# Patient Record
Sex: Female | Born: 1985 | Hispanic: Yes | Smoking: Never smoker
Health system: Southern US, Community
[De-identification: ages and names within clinical notes are randomized; demographics above are authoritative.]

## PROBLEM LIST (undated history)

## (undated) DIAGNOSIS — J45909 Unspecified asthma, uncomplicated: Secondary | ICD-10-CM

## (undated) DIAGNOSIS — F419 Anxiety disorder, unspecified: Secondary | ICD-10-CM

## (undated) DIAGNOSIS — IMO0002 Reserved for concepts with insufficient information to code with codable children: Secondary | ICD-10-CM

## (undated) DIAGNOSIS — F32A Depression, unspecified: Secondary | ICD-10-CM

## (undated) DIAGNOSIS — Z9109 Other allergy status, other than to drugs and biological substances: Secondary | ICD-10-CM

## (undated) DIAGNOSIS — D649 Anemia, unspecified: Secondary | ICD-10-CM

## (undated) DIAGNOSIS — R42 Dizziness and giddiness: Secondary | ICD-10-CM

## (undated) DIAGNOSIS — E559 Vitamin D deficiency, unspecified: Secondary | ICD-10-CM

## (undated) HISTORY — DX: Dizziness and giddiness: R42

## (undated) HISTORY — DX: Unspecified asthma, uncomplicated: J45.909

## (undated) HISTORY — DX: Reserved for concepts with insufficient information to code with codable children: IMO0002

## (undated) HISTORY — DX: Other allergy status, other than to drugs and biological substances: Z91.09

---

## 2006-08-09 ENCOUNTER — Emergency Department: Payer: Self-pay | Admitting: Internal Medicine

## 2008-07-10 ENCOUNTER — Emergency Department: Payer: Self-pay | Admitting: Emergency Medicine

## 2008-11-13 DIAGNOSIS — E0590 Thyrotoxicosis, unspecified without thyrotoxic crisis or storm: Secondary | ICD-10-CM

## 2008-11-13 HISTORY — DX: Thyrotoxicosis, unspecified without thyrotoxic crisis or storm: E05.90

## 2009-02-14 ENCOUNTER — Emergency Department: Payer: Self-pay | Admitting: Internal Medicine

## 2011-06-28 LAB — HM PAP SMEAR: HM PAP: NEGATIVE

## 2011-10-10 ENCOUNTER — Ambulatory Visit: Payer: Self-pay | Admitting: Internal Medicine

## 2012-07-10 ENCOUNTER — Ambulatory Visit: Payer: Self-pay

## 2013-07-07 ENCOUNTER — Emergency Department: Payer: Self-pay | Admitting: Internal Medicine

## 2013-07-07 LAB — CBC
HCT: 40.1 % (ref 35.0–47.0)
HGB: 13.7 g/dL (ref 12.0–16.0)
MCH: 29.7 pg (ref 26.0–34.0)
MCV: 87 fL (ref 80–100)
Platelet: 279 10*3/uL (ref 150–440)
RBC: 4.62 10*6/uL (ref 3.80–5.20)
RDW: 12.9 % (ref 11.5–14.5)
WBC: 5.7 10*3/uL (ref 3.6–11.0)

## 2013-07-07 LAB — COMPREHENSIVE METABOLIC PANEL
Alkaline Phosphatase: 55 U/L (ref 50–136)
Anion Gap: 5 — ABNORMAL LOW (ref 7–16)
Bilirubin,Total: 0.4 mg/dL (ref 0.2–1.0)
Calcium, Total: 9.2 mg/dL (ref 8.5–10.1)
Chloride: 106 mmol/L (ref 98–107)
Co2: 26 mmol/L (ref 21–32)
Osmolality: 271 (ref 275–301)
Potassium: 4.1 mmol/L (ref 3.5–5.1)
SGOT(AST): 19 U/L (ref 15–37)
SGPT (ALT): 17 U/L (ref 12–78)
Sodium: 137 mmol/L (ref 136–145)

## 2013-07-07 LAB — URINALYSIS, COMPLETE
Blood: NEGATIVE
Specific Gravity: 1.006 (ref 1.003–1.030)
WBC UR: 1 /HPF (ref 0–5)

## 2013-07-07 LAB — TSH: Thyroid Stimulating Horm: 3.47 u[IU]/mL

## 2013-08-03 ENCOUNTER — Ambulatory Visit: Payer: Self-pay | Admitting: Family Medicine

## 2013-08-03 LAB — CBC WITH DIFFERENTIAL/PLATELET
Basophil #: 0 10*3/uL (ref 0.0–0.1)
Basophil %: 0.6 %
HCT: 40.3 % (ref 35.0–47.0)
HGB: 13.1 g/dL (ref 12.0–16.0)
Lymphocyte %: 22.4 %
MCH: 28.4 pg (ref 26.0–34.0)
MCHC: 32.6 g/dL (ref 32.0–36.0)
Monocyte #: 0.5 x10 3/mm (ref 0.2–0.9)
Neutrophil #: 4.3 10*3/uL (ref 1.4–6.5)
Neutrophil %: 67 %
Platelet: 262 10*3/uL (ref 150–440)
RBC: 4.63 10*6/uL (ref 3.80–5.20)
WBC: 6.4 10*3/uL (ref 3.6–11.0)

## 2013-08-03 LAB — URINALYSIS, COMPLETE
Bilirubin,UR: NEGATIVE
Blood: NEGATIVE
Leukocyte Esterase: NEGATIVE
Nitrite: NEGATIVE
Protein: NEGATIVE
Specific Gravity: 1.025 (ref 1.003–1.030)

## 2013-08-03 LAB — LIPASE, BLOOD: Lipase: 397 U/L — ABNORMAL HIGH (ref 73–393)

## 2013-08-03 LAB — COMPREHENSIVE METABOLIC PANEL
Albumin: 3.9 g/dL (ref 3.4–5.0)
Alkaline Phosphatase: 61 U/L (ref 50–136)
Anion Gap: 10 (ref 7–16)
Bilirubin,Total: 0.3 mg/dL (ref 0.2–1.0)
Calcium, Total: 8.6 mg/dL (ref 8.5–10.1)
Chloride: 105 mmol/L (ref 98–107)
Co2: 24 mmol/L (ref 21–32)
Creatinine: 0.56 mg/dL — ABNORMAL LOW (ref 0.60–1.30)
EGFR (African American): >60
EGFR (Non-African Amer.): >60
SGPT (ALT): 18 U/L (ref 12–78)

## 2013-08-03 LAB — AMYLASE: Amylase: 104 U/L (ref 25–115)

## 2013-08-03 LAB — PREGNANCY, URINE: Pregnancy Test, Urine: NEGATIVE m[IU]/mL

## 2013-10-28 ENCOUNTER — Ambulatory Visit (INDEPENDENT_AMBULATORY_CARE_PROVIDER_SITE_OTHER): Payer: BC Managed Care – PPO | Admitting: Podiatry

## 2013-10-28 ENCOUNTER — Encounter: Payer: Self-pay | Admitting: Podiatry

## 2013-10-28 ENCOUNTER — Ambulatory Visit (INDEPENDENT_AMBULATORY_CARE_PROVIDER_SITE_OTHER): Payer: BC Managed Care – PPO

## 2013-10-28 VITALS — BP 129/86 | HR 80 | Resp 16 | Ht 62.0 in | Wt 120.0 lb

## 2013-10-28 DIAGNOSIS — M21612 Bunion of left foot: Secondary | ICD-10-CM

## 2013-10-28 DIAGNOSIS — M2010 Hallux valgus (acquired), unspecified foot: Secondary | ICD-10-CM

## 2013-10-28 DIAGNOSIS — M21619 Bunion of unspecified foot: Secondary | ICD-10-CM

## 2013-10-28 DIAGNOSIS — M779 Enthesopathy, unspecified: Secondary | ICD-10-CM

## 2013-10-28 MED ORDER — TRIAMCINOLONE ACETONIDE 10 MG/ML IJ SUSP
10.0000 mg | Freq: Once | INTRAMUSCULAR | Status: AC
  Administered 2013-10-28: 10 mg

## 2013-10-28 NOTE — Progress Notes (Signed)
Subjective:     Patient ID: Laura Morton, female   DOB: Oct 05, 1986, 27 y.o.   MRN: [id number]  Foot Pain   stating I'm having pain around my left big toe joint of several weeks duration. States she does not remember specific injury but does note that her sister has bunions   Review of Systems  All other systems reviewed and are negative.       Objective:   Physical Exam  Nursing note and vitals reviewed. Constitutional: She is oriented to person, place, and time. She appears well-nourished.  Cardiovascular: Intact distal pulses.   Musculoskeletal: Normal range of motion.  Neurological: She is oriented to person, place, and time.  Skin: Skin is warm.   neurovascular status intact with normal muscle strength noted. Edema noted around the first metatarsal head left with fluid buildup and no loss or range of motion or crepitus upon movement of the first MPJ    Assessment:     Probable capsulitis of the first MPJ with mild structural bunion deformity    Plan:     H&P and x-ray reviewed. Today I injected around the first MPJ 3 mg Kenalog 5 mg Xylocaine Marcaine mixture and instructed on wider shoes for the next few days and to reappoint if symptoms continue

## 2013-10-28 NOTE — Progress Notes (Signed)
N PAIN L LEFT FOOT AROUND GREAT TOE D 2 WEEKS O SLOWLY C WORSE A AM BAD, WALKING T CHANGED SHOES,

## 2014-02-07 ENCOUNTER — Ambulatory Visit: Payer: Self-pay | Admitting: Physician Assistant

## 2014-10-22 ENCOUNTER — Ambulatory Visit: Payer: Self-pay | Admitting: General Practice

## 2015-10-09 ENCOUNTER — Encounter: Payer: Self-pay | Admitting: Gynecology

## 2015-10-09 ENCOUNTER — Ambulatory Visit
Admission: EM | Admit: 2015-10-09 | Discharge: 2015-10-09 | Disposition: A | Discharge status: Home / Self Care | Payer: PRIVATE HEALTH INSURANCE | Attending: Family Medicine | Admitting: Family Medicine

## 2015-10-09 DIAGNOSIS — J029 Acute pharyngitis, unspecified: Secondary | ICD-10-CM

## 2015-10-09 LAB — RAPID STREP SCREEN (MED CTR MEBANE ONLY): Streptococcus, Group A Screen (Direct): NEGATIVE

## 2015-10-09 MED ORDER — AMOXICILLIN 875 MG PO TABS: 875.0000 mg | ORAL_TABLET | Freq: Two times a day (BID) | ORAL | Status: DC

## 2015-10-09 NOTE — ED Notes (Signed)
Patient c/o sore throat / chills  X 2 days.

## 2015-10-09 NOTE — Discharge Instructions (Signed)
Take medication as prescribed. Rest. Drink plenty of fluids.   Follow up with your primary care physician as needed. Return to Urgent care as needed for new or worsening concerns.   Pharyngitis Pharyngitis is a sore throat (pharynx). There is redness, pain, and swelling of your throat. HOME CARE   Drink enough fluids to keep your pee (urine) clear or pale yellow.  Only take medicine as told by your doctor.  You may get sick again if you do not take medicine as told. Finish your medicines, even if you start to feel better.  Do not take aspirin.  Rest.  Rinse your mouth (gargle) with salt water ( tsp of salt per 1 qt of water) every 1-2 hours. This will help the pain.  If you are not at risk for choking, you can suck on hard candy or sore throat lozenges. GET HELP IF:  You have large, tender lumps on your neck.  You have a rash.  You cough up green, yellow-brown, or bloody spit. GET HELP RIGHT AWAY IF:   You have a stiff neck.  You drool or cannot swallow liquids.  You throw up (vomit) or are not able to keep medicine or liquids down.  You have very bad pain that does not go away with medicine.  You have problems breathing (not from a stuffy nose). MAKE SURE YOU:   Understand these instructions.  Will watch your condition.  Will get help right away if you are not doing well or get worse.   This information is not intended to replace advice given to you by your health care provider. Make sure you discuss any questions you have with your health care provider.   Document Released: 04/17/2008 Document Revised: 08/20/2013 Document Reviewed: 07/07/2013 Elsevier Interactive Patient Education [employer].

## 2015-10-09 NOTE — ED Provider Notes (Signed)
[facility]  ____________________________________________  Time seen: Approximately 5:32 PM  I have reviewed the triage vital signs and the nursing notes.   HISTORY  Chief Complaint Sore Throat   HPI Laura Morton is a 29 y.o. female presents for the complaints of 2 days of sore throat and fever. Reports did not check temperature but has recently felt warm with cold chills. States sore throat has persisted. Denies cough, congestion, runny nose, abdominal pain, nausea or other complaints. Denies known sick contacts. Reports significant other without any sickness.  States current sore throat pain is 5 out of 10 and aching and scratchy. States that it does hurt to swallow foods. States that cold fluids helps sore throat. Denies difficulty swallowing. Reports continues to eat and drink well.    Past Medical History  Diagnosis Date  . Asthma   . Environmental allergies   . Vertigo     There are no active problems to display for this patient.   History reviewed. No pertinent past surgical history.  Current Outpatient Rx  Name  Route  Sig  Dispense  Refill  . Albuterol Sulfate (PROAIR HFA IN)   Inhalation   Inhale into the lungs as needed.         . Beclomethasone Dipropionate (QVAR IN)   Inhalation   Inhale into the lungs as needed.         . Loratadine (CLARITIN PO)   Oral   Take by mouth daily.         . Montelukast Sodium (SINGULAIR PO)   Oral   Take by mouth daily.         . Multiple Vitamins-Minerals (MULTIVITAMIN PO)   Oral   Take by mouth daily.         Marland Kitchen PARAGARD INTRAUTERINE COPPER IUD IUD   Intrauterine   1 each by Intrauterine route once.         . Levonorgestrel (MIRENA IU)   Intrauterine   by Intrauterine route daily.          Last menstrual.: Current. Denies chance of pregnancy.  Allergies Review of patient's allergies indicates no known allergies.  No family history on file.  Social History Social History   Substance Use Topics  . Smoking status: Never Smoker   . Smokeless tobacco: Never Used  . Alcohol Use: No    Review of Systems Constitutional: Positive subjective fevers. States did not measure with thermometer.  Eyes: No visual changes. [id number] sore throat. Denies cough, congestion, runny nose.  Cardiovascular: Denies chest pain. Respiratory: Denies shortness of breath. Gastrointestinal: No abdominal pain.  No nausea, no vomiting.  No diarrhea.  No constipation. Genitourinary: Negative for dysuria. Musculoskeletal: Negative for back pain. Skin: Negative for rash. Neurological: Negative for headaches, focal weakness or numbness.  10-point ROS otherwise negative.  ____________________________________________   PHYSICAL EXAM:  VITAL SIGNS: ED Triage Vitals  Enc Vitals Group     BP 10/09/15 1709 120/78 mmHg     Pulse Rate 10/09/15 1709 98     Resp -- 18     Temp 10/09/15 1709 99.4 F (37.4 C)     Temp Source 10/09/15 1709 Oral     SpO2 10/09/15 1709 100 %     Weight 10/09/15 1709 105 lb (47.628 kg)     Height 10/09/15 1709 5\' 2"  (1.575 m)     Head Cir --      Peak Flow --      Pain Score 10/09/15 1709 8  Pain Loc --      Pain Edu? --      Excl. in [place]? --     Constitutional: Alert and oriented. Well appearing and in no acute distress. Eyes: Conjunctivae are normal. PERRL. EOMI. Head: Atraumatic.Nontender sinuses. No swelling. No erythema.  Ears: no erythema, normal TMs bilaterally.   Nose: No congestion/rhinnorhea.  Mouth/Throat: Mucous membranes are moist. Moderate pharyngeal erythema with 1-2+ bilateral tonsillar swelling, with bilateral mild amount of exudate . No uvular shift or deviation.  Neck: No stridor.  No cervical spine tenderness to palpation. Hematological/Lymphatic/Immunilogical:Mild anterior  cervical lymphadenopathy. Cardiovascular: Normal rate, regular rhythm. Grossly normal heart sounds.  Good peripheral circulation. Respiratory: Normal  respiratory effort.  No retractions. Lungs CTAB.No wheezes, rales or rhonchi.  Gastrointestinal: Soft and nontender. No distention. Normal Bowel sounds. No hepatomegaly, no splenomegaly palpated.  Musculoskeletal: No lower or upper extremity tenderness nor edema.   Neurologic:  Normal speech and language. No gross focal neurologic deficits are appreciated. No gait instability. Skin:  Skin is warm, dry and intact. No rash noted. Psychiatric: Mood and affect are normal. Speech and behavior are normal.  ____________________________________________   LABS (all labs ordered are listed, but only abnormal results are displayed)  Labs Reviewed  RAPID STREP SCREEN (NOT AT [facility])  CULTURE, GROUP A STREP ([place])    INITIAL IMPRESSION / ASSESSMENT AND PLAN / ED COURSE  Pertinent labs & imaging results that were available during my care of the patient were reviewed by me and considered in my medical decision making (see chart for details).  Very well-appearing patient. No acute distress. Presents for the complaints of 2 days of sore throat and fever. Reports continues to drink fluids well. Denies known sick contacts. Patient with moderate pharyngeal erythema and bilateral tonsillar swelling and mild exudate. Suspect streptococcal pharyngitis. Quick strep negative, will culture. Discussed patient evaluation and testing for mono. States that she does not want to be tested for mono at this time and reports that if sore throat continues she will follow-up with her primary care physician or urgent care. Again suspect streptococcal pharyngitis. Will treat with oral amoxicillin and supportive treatments including rest, fluids, when necessary over-the-counter Tylenol or ibuprofen.  Discussed follow up with Primary care physician this week. Discussed follow up and return parameters including no resolution or any worsening concerns. Patient verbalized understanding and agreed to plan.    ____________________________________________   FINAL CLINICAL IMPRESSION(S) / ED DIAGNOSES  Final diagnoses:  Pharyngitis       Marylene Land, NP 10/09/15 1810

## 2015-10-13 LAB — CULTURE, GROUP A STREP (THRC)

## 2015-10-14 ENCOUNTER — Telehealth: Payer: Self-pay | Admitting: Emergency Medicine

## 2015-10-14 NOTE — ED Notes (Signed)
Patient notified that her throat culture came back positive for Strep.  Patient was instructed to continue with her Amoxicillin and to follow-up here or with her PCP if her symptoms do not improve or worsen.  Patient verbalized understanding.

## 2015-12-13 DIAGNOSIS — J309 Allergic rhinitis, unspecified: Secondary | ICD-10-CM | POA: Insufficient documentation

## 2015-12-13 DIAGNOSIS — J45909 Unspecified asthma, uncomplicated: Secondary | ICD-10-CM

## 2015-12-15 ENCOUNTER — Ambulatory Visit (INDEPENDENT_AMBULATORY_CARE_PROVIDER_SITE_OTHER): Payer: 59 | Admitting: Unknown Physician Specialty

## 2015-12-15 ENCOUNTER — Encounter: Payer: Self-pay | Admitting: Unknown Physician Specialty

## 2015-12-15 VITALS — BP 126/83 | HR 81 | Temp 98.4°F | Ht 62.6 in | Wt 110.8 lb

## 2015-12-15 DIAGNOSIS — Z7184 Encounter for health counseling related to travel: Secondary | ICD-10-CM

## 2015-12-15 DIAGNOSIS — Z7189 Other specified counseling: Secondary | ICD-10-CM

## 2015-12-15 DIAGNOSIS — F40243 Fear of flying: Secondary | ICD-10-CM | POA: Diagnosis not present

## 2015-12-15 MED ORDER — LORAZEPAM 0.5 MG PO TABS: 0.5000 mg | ORAL_TABLET | Freq: Two times a day (BID) | ORAL | Status: DC | PRN

## 2015-12-15 NOTE — Progress Notes (Signed)
   BP 126/83 mmHg  Pulse 81  Temp(Src) 98.4 F (36.9 C)  Ht 5' 2.6" (1.59 m)  Wt 110 lb 12.8 oz (50.259 kg)  BMI 19.88 kg/m2  SpO2 95%  LMP 12/03/2015 (Exact Date)   Subjective:    Patient ID: Laura Morton, female    DOB: 05-28-1986, 30 y.o.   MRN: [id number]  HPI: Laura Morton is a 30 y.o. female  Chief Complaint  Patient presents with  . Establish Care    pt states she wants to talk about vaccines and her flight to [place] coming up in April   Travel vaccines Pt states she has never flown before and would like something for anxiety for her flight.  She also wonders what her vaccines are.   Health Maintenance Pt goes to [facility] for her health maintenance items.    Old chart reviewed.   Family and PMH reviewed.    Relevant past medical, surgical, family and social history reviewed and updated as indicated. Interim medical history since our last visit reviewed. Allergies and medications reviewed and updated.  Review of Systems  Constitutional: Negative.   HENT: Negative.   Eyes: Negative.   Respiratory: Negative.   Cardiovascular: Negative.   Gastrointestinal: Negative.   Endocrine: Negative.   Genitourinary: Negative.   Musculoskeletal: Negative.   Skin: Negative.   Allergic/Immunologic: Negative.   Neurological: Negative.   Hematological: Negative.   Psychiatric/Behavioral: Negative.     Per HPI unless specifically indicated above     Objective:    BP 126/83 mmHg  Pulse 81  Temp(Src) 98.4 F (36.9 C)  Ht 5' 2.6" (1.59 m)  Wt 110 lb 12.8 oz (50.259 kg)  BMI 19.88 kg/m2  SpO2 95%  LMP 12/03/2015 (Exact Date)  Wt Readings from Last 3 Encounters:  12/15/15 110 lb 12.8 oz (50.259 kg)  10/09/15 105 lb (47.628 kg)  10/28/13 120 lb (54.432 kg)    Physical Exam  Constitutional: She is oriented to person, place, and time. She appears well-developed and well-nourished. No distress.  HENT:  Head: Normocephalic and atraumatic.  Eyes: Conjunctivae and  lids are normal. Right eye exhibits no discharge. Left eye exhibits no discharge. No scleral icterus.  Cardiovascular: Normal rate.   Pulmonary/Chest: Effort normal.  Abdominal: Normal appearance. There is no splenomegaly or hepatomegaly.  Musculoskeletal: Normal range of motion.  Neurological: She is alert and oriented to person, place, and time.  Skin: Skin is intact. No rash noted. No pallor.  Psychiatric: She has a normal mood and affect. Her behavior is normal. Judgment and thought content normal.    Results for orders placed or performed in visit on 12/13/15  HM PAP SMEAR  Result Value Ref Range   HM Pap smear negative-from PP       Assessment & Plan:   Problem List Items Addressed This Visit      Unprioritized   Fear of flying - Primary   Relevant Medications   LORazepam (ATIVAN) 0.5 MG tablet    Other Visit Diagnoses    Travel advice encounter        Prescription written for Hepatitis A and Typhoid        Follow up plan: Return if symptoms worsen or fail to improve.

## 2016-01-07 ENCOUNTER — Emergency Department: Payer: PRIVATE HEALTH INSURANCE

## 2016-01-07 ENCOUNTER — Encounter: Payer: Self-pay | Admitting: Emergency Medicine

## 2016-01-07 ENCOUNTER — Emergency Department
Admission: EM | Admit: 2016-01-07 | Discharge: 2016-01-07 | Disposition: A | Discharge status: Home / Self Care | Payer: PRIVATE HEALTH INSURANCE | Attending: Emergency Medicine | Admitting: Emergency Medicine

## 2016-01-07 DIAGNOSIS — Y998 Other external cause status: Secondary | ICD-10-CM | POA: Insufficient documentation

## 2016-01-07 DIAGNOSIS — Z7951 Long term (current) use of inhaled steroids: Secondary | ICD-10-CM | POA: Diagnosis not present

## 2016-01-07 DIAGNOSIS — M779 Enthesopathy, unspecified: Secondary | ICD-10-CM | POA: Insufficient documentation

## 2016-01-07 DIAGNOSIS — Y9389 Activity, other specified: Secondary | ICD-10-CM | POA: Insufficient documentation

## 2016-01-07 DIAGNOSIS — Y92410 Unspecified street and highway as the place of occurrence of the external cause: Secondary | ICD-10-CM | POA: Diagnosis not present

## 2016-01-07 DIAGNOSIS — Z793 Long term (current) use of hormonal contraceptives: Secondary | ICD-10-CM | POA: Insufficient documentation

## 2016-01-07 DIAGNOSIS — S4991XA Unspecified injury of right shoulder and upper arm, initial encounter: Secondary | ICD-10-CM | POA: Diagnosis present

## 2016-01-07 DIAGNOSIS — M7581 Other shoulder lesions, right shoulder: Secondary | ICD-10-CM

## 2016-01-07 DIAGNOSIS — Z79899 Other long term (current) drug therapy: Secondary | ICD-10-CM | POA: Insufficient documentation

## 2016-01-07 MED ORDER — IBUPROFEN 800 MG PO TABS
800.0000 mg | ORAL_TABLET | Freq: Once | ORAL | Status: AC
  Administered 2016-01-07: 800 mg via ORAL
  Filled 2016-01-07: qty 1
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
## 2016-01-07 NOTE — Discharge Instructions (Signed)
Rotator Cuff Tendinitis  Rotator cuff tendinitis is inflammation of the tough, cord-like bands that connect muscle to bone (tendons) in your rotator cuff. Your rotator cuff is the collection of all the muscles and tendons that connect your arm to your shoulder. Your rotator cuff holds the head of your upper arm bone (humerus) in the cup (fossa) of your shoulder blade (scapula).  CAUSES  Rotator cuff tendinitis is usually caused by overusing the joint involved.   SIGNS AND SYMPTOMS  · Deep ache in the shoulder also felt on the outside upper arm over the shoulder muscle.  · Point tenderness over the area that is injured.  · Pain comes on gradually and becomes worse with lifting the arm to the side (abduction) or turning it inward (internal rotation).  · May lead to a chronic tear: When a rotator cuff tendon becomes inflamed, it runs the risk of losing its blood supply, causing some tendon fibers to die. This increases the risk that the tendon can fray and partially or completely tear.  DIAGNOSIS  Rotator cuff tendinitis is diagnosed by taking a medical history, performing a physical exam, and reviewing results of imaging exams. The medical history is useful to help determine the type of rotator cuff injury. The physical exam will include looking at the injured shoulder, feeling the injured area, and watching you do range-of-motion exercises. X-ray exams are typically done to rule out other causes of shoulder pain, such as fractures. MRI is the imaging exam usually used for significant shoulder injuries. Sometimes a dye study called CT arthrogram is done, but it is not as widely used as MRI. In some institutions, special ultrasound tests may also be used to aid in the diagnosis.  TREATMENT   Less Severe Cases  · Use of a sling to rest the shoulder for a short period of time. Prolonged use of the sling can cause stiffness, weakness, and loss of motion of the shoulder joint.  · Anti-inflammatory medicines, such as  ibuprofen or naproxen sodium, may be prescribed.  More Severe Cases  · Physical therapy.  · Use of steroid injections into the shoulder joint.  · Surgery.  HOME CARE INSTRUCTIONS   · Use a sling or splint until the pain decreases. Prolonged use of the sling can cause stiffness, weakness, and loss of motion of the shoulder joint.  · Apply ice to the injured area:    Put ice in a plastic bag.    Place a towel between your skin and the bag.    Leave the ice on for 20 minutes, 2-3 times a day.  · Try to avoid use other than gentle range of motion while your shoulder is painful. Use the shoulder and exercise only as directed by your health care provider. Stop exercises or range of motion if pain or discomfort increases, unless directed otherwise by your health care provider.  · Only take over-the-counter or prescription medicines for pain, discomfort, or fever as directed by your health care provider.  · If you were given a shoulder sling and straps (immobilizer), do not remove it except as directed, or until you see a health care provider for a follow-up exam. If you need to remove it, move your arm as little as possible or as directed.  · You may want to sleep on several pillows at night to lessen swelling and pain.  SEEK IMMEDIATE MEDICAL CARE IF:   · Your shoulder pain increases or new pain develops in your arm, hand,   or fingers and is not relieved with medicines.  · You have new, unexplained symptoms, especially increased numbness in the hands or loss of strength.  · You develop any worsening of the problems that brought you in for care.  · Your arm, hand, or fingers are numb or tingling.  · Your arm, hand, or fingers are swollen, painful, or turn white or blue.  MAKE SURE YOU:  · Understand these instructions.  · Will watch your condition.  · Will get help right away if you are not doing well or get worse.     This information is not intended to replace advice given to you by your health care provider. Make sure  you discuss any questions you have with your health care provider.     Document Released: 01/20/2004 Document Revised: 11/20/2014 Document Reviewed: 06/11/2013  Elsevier Interactive Patient Education ©2016 [employer]
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
## 2017-07-08 ENCOUNTER — Emergency Department: Payer: Self-pay

## 2017-07-08 ENCOUNTER — Encounter: Payer: Self-pay | Admitting: Emergency Medicine

## 2017-07-08 ENCOUNTER — Emergency Department
Admission: EM | Admit: 2017-07-08 | Discharge: 2017-07-08 | Disposition: A | Discharge status: Home / Self Care | Payer: Self-pay | Attending: Emergency Medicine | Admitting: Emergency Medicine

## 2017-07-08 DIAGNOSIS — D500 Iron deficiency anemia secondary to blood loss (chronic): Secondary | ICD-10-CM

## 2017-07-08 DIAGNOSIS — J45909 Unspecified asthma, uncomplicated: Secondary | ICD-10-CM | POA: Insufficient documentation

## 2017-07-08 DIAGNOSIS — N3001 Acute cystitis with hematuria: Secondary | ICD-10-CM

## 2017-07-08 DIAGNOSIS — R0602 Shortness of breath: Secondary | ICD-10-CM | POA: Insufficient documentation

## 2017-07-08 DIAGNOSIS — I878 Other specified disorders of veins: Secondary | ICD-10-CM

## 2017-07-08 DIAGNOSIS — Z79899 Other long term (current) drug therapy: Secondary | ICD-10-CM | POA: Insufficient documentation

## 2017-07-08 LAB — URINALYSIS, COMPLETE (UACMP) WITH MICROSCOPIC
Bilirubin Urine: NEGATIVE
GLUCOSE, UA: NEGATIVE mg/dL
KETONES UR: 5 mg/dL — AB
Nitrite: NEGATIVE
PH: 5 (ref 5.0–8.0)
Protein, ur: 30 mg/dL — AB
Specific Gravity, Urine: 1.027 (ref 1.005–1.030)

## 2017-07-08 LAB — COMPREHENSIVE METABOLIC PANEL
ALBUMIN: 4.2 g/dL (ref 3.5–5.0)
ALT: 12 U/L — ABNORMAL LOW (ref 14–54)
ANION GAP: 5 (ref 5–15)
AST: 20 U/L (ref 15–41)
Alkaline Phosphatase: 51 U/L (ref 38–126)
BILIRUBIN TOTAL: 0.2 mg/dL — AB (ref 0.3–1.2)
BUN: 9 mg/dL (ref 6–20)
CHLORIDE: 110 mmol/L (ref 101–111)
CO2: 24 mmol/L (ref 22–32)
Calcium: 9.2 mg/dL (ref 8.9–10.3)
Creatinine, Ser: 0.54 mg/dL (ref 0.44–1.00)
GFR calc Af Amer: >60 mL/min (ref >60)
Glucose, Bld: 117 mg/dL — ABNORMAL HIGH (ref 65–99)
POTASSIUM: 3.9 mmol/L (ref 3.5–5.1)
Sodium: 139 mmol/L (ref 135–145)
TOTAL PROTEIN: 7.4 g/dL (ref 6.5–8.1)

## 2017-07-08 LAB — CBC WITH DIFFERENTIAL/PLATELET
BASOS PCT: 1 %
Basophils Absolute: 0 10*3/uL (ref 0–0.1)
EOS PCT: 3 %
Eosinophils Absolute: 0.1 10*3/uL (ref 0–0.7)
HEMATOCRIT: 30.4 % — AB (ref 35.0–47.0)
Hemoglobin: 9.9 g/dL — ABNORMAL LOW (ref 12.0–16.0)
Lymphocytes Relative: 29 %
Lymphs Abs: 1.4 10*3/uL (ref 1.0–3.6)
MCH: 23.5 pg — ABNORMAL LOW (ref 26.0–34.0)
MCHC: 32.4 g/dL (ref 32.0–36.0)
MCV: 72.4 fL — AB (ref 80.0–100.0)
MONOS PCT: 10 %
Monocytes Absolute: 0.5 10*3/uL (ref 0.2–0.9)
NEUTROS ABS: 2.8 10*3/uL (ref 1.4–6.5)
Neutrophils Relative %: 57 %
PLATELETS: 367 10*3/uL (ref 150–440)
RBC: 4.19 MIL/uL (ref 3.80–5.20)
RDW: 15.7 % — ABNORMAL HIGH (ref 11.5–14.5)
WBC: 5 10*3/uL (ref 3.6–11.0)

## 2017-07-08 LAB — FIBRIN DERIVATIVES D-DIMER (ARMC ONLY): FIBRIN DERIVATIVES D-DIMER (ARMC): 157.89 (ref 0.00–499.00)

## 2017-07-08 LAB — TSH: TSH: 3.721 u[IU]/mL (ref 0.350–4.500)

## 2017-07-08 LAB — BRAIN NATRIURETIC PEPTIDE: B NATRIURETIC PEPTIDE 5: 50 pg/mL (ref 0.0–100.0)

## 2017-07-08 LAB — POCT PREGNANCY, URINE: Preg Test, Ur: NEGATIVE

## 2017-07-08 MED ORDER — NITROFURANTOIN MONOHYD MACRO 100 MG PO CAPS
100.0000 mg | ORAL_CAPSULE | Freq: Once | ORAL | Status: AC
  Administered 2017-07-08: 100 mg via ORAL
  Filled 2017-07-08: qty 1

## 2017-07-08 MED ORDER — IRON 325 (65 FE) MG PO TABS
1.0000 | ORAL_TABLET | Freq: Every day | ORAL | 1 refills | Status: DC
Start: 2017-07-08 — End: 2020-06-09

## 2017-07-08 MED ORDER — SODIUM CHLORIDE 0.9 % IV BOLUS (SEPSIS)
1000.0000 mL | Freq: Once | INTRAVENOUS | Status: AC
  Administered 2017-07-08: 1000 mL via INTRAVENOUS

## 2017-07-08 MED ORDER — NITROFURANTOIN MONOHYD MACRO 100 MG PO CAPS: 100.0000 mg | ORAL_CAPSULE | Freq: Two times a day (BID) | ORAL | 0 refills | Status: AC

## 2017-07-08 NOTE — ED Notes (Signed)
Dr Alfred Levins back to bedside for update and re-eval

## 2017-07-08 NOTE — ED Notes (Signed)
Spoke with Dr. Archie Balboa about pt. Per Dr. Archie Balboa pt needs CBC, CMP, Ua, and Urine preg.

## 2017-07-08 NOTE — ED Provider Notes (Signed)
[facility] Emergency Department Provider Note  ____________________________________________  Time seen: Approximately 7:25 PM  I have reviewed the triage vital signs and the nursing notes.   HISTORY  Chief Complaint Leg Swelling; Shortness of Breath; and Vaginal Discharge   HPI Laura Morton is a 31 y.o. female the history of asthma who presents for evaluation of bilateral leg swelling and shortness of breath. Patient reports over the course of the last 4-5 months she has noticed progressively worsening swelling of both her legs with left worse than right. The swelling usually resolved by the morning but by the end of the day gets worse. 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 Over the course of the last 2 months she has noticed progressively worsening shortness of breath and tells me now she is unable to go up a flight of stairs without feeling very winded. She has had asthma as a kid but denies any wheezing, cough, fever, or chest pain. Patient denies personal or family history blood clots, her last longer trips was 2 months ago when she came back from [place] where she was leaving for a year. She has an IUD but the [place] one and no hormones. No pain in her legs. No hemoptysis. NO Sob at rest.   Past Medical History:  Diagnosis Date  . Asthma   . Environmental allergies   . Knee fracture, left   . Vertigo     Patient Active Problem List   Diagnosis Date Noted  . 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 Allergic rhinitis 12/13/2015  . Asthma 12/13/2015    History reviewed. No pertinent surgical history.  Prior to Admission medications   Medication Sig Start Date End Date Taking? Authorizing Provider  Albuterol Sulfate (PROAIR HFA IN) Inhale into the lungs as needed.    [provider]  beclomethasone (QVAR) 40 MCG/ACT inhaler Inhale into the lungs 2 (two) times daily.    [provider]  Ferrous Sulfate (IRON) 325 (65 Fe) MG  TABS Take 1 tablet (325 mg total) by mouth daily. 07/08/17   Rudene Re, MD  loratadine (CLARITIN) 10 MG tablet Take 10 mg by mouth daily.    [provider]  LORazepam (ATIVAN) 0.5 MG tablet Take 1 tablet (0.5 mg total) by mouth 2 (two) times daily as needed for anxiety. 12/15/15   Kathrine Haddock, NP  montelukast (SINGULAIR) 10 MG tablet Take 10 mg by mouth daily.    [provider]  Multiple Vitamins-Minerals (MULTIVITAMIN PO) Take by mouth daily.    [provider]  nitrofurantoin, macrocrystal-monohydrate, (MACROBID) 100 MG capsule Take 1 capsule (100 mg total) by mouth 2 (two) times daily. 07/08/17 07/13/17  Rudene Re, MD  [place] IUD IUD 1 each by Intrauterine route once.    [provider]    Allergies Patient has no known allergies.  Family History  Problem Relation Age of Onset  . Diabetes Mother   . Lupus Mother   . Hypertension Mother   . Hypertension Father   . Heart disease Sister   . Spina bifida Sister     Social History Social History  Substance Use Topics  . Smoking status: Never Smoker  . Smokeless tobacco: Never Used  . Alcohol use No    Review of Systems  Constitutional: Negative for fever. Eyes: Negative for visual changes. ENT: Negative for sore throat. Neck: No neck pain  Cardiovascular: Negative for chest pain. Respiratory: + shortness of  breath. Gastrointestinal: Negative for abdominal pain, vomiting or diarrhea. Genitourinary: Negative for dysuria. Musculoskeletal: Negative for back pain. + b/l leg swelling Skin: Negative for rash. Neurological: Negative for headaches, weakness or numbness. Psych: No SI or HI  ____________________________________________   PHYSICAL EXAM:  VITAL SIGNS: ED Triage Vitals [07/08/17 1602]  Enc Vitals Group     BP 124/88     Pulse Rate (!) 103     Resp (!) 6     Temp 98.7 F (37.1 C)     Temp Source Oral     SpO2 97 %     Weight       Height      Head Circumference      Peak Flow      Pain Score      Pain Loc      Pain Edu?      Excl. in [place]?     Constitutional: Alert and oriented. Well appearing and in no apparent distress. HEENT:      Head: Normocephalic and atraumatic.         Eyes: Conjunctivae are normal. Sclera is non-icteric.       Mouth/Throat: Mucous membranes are moist.       Neck: Supple with no signs of meningismus. Cardiovascular: Tachycardic with regular rhythm. No murmurs, gallops, or rubs. 2+ symmetrical distal pulses are present in all extremities. No JVD. Respiratory: Normal respiratory effort. Lungs are clear to auscultation bilaterally. No wheezes, crackles, or rhonchi.  Gastrointestinal: Soft, non tender, and non distended with positive bowel sounds. No rebound or guarding. Genitourinary: No CVA tenderness. Musculoskeletal: Nontender with normal range of motion in all extremities. No edema, cyanosis, or erythema of extremities. Neurologic: Normal speech and language. Face is symmetric. Moving all extremities. No gross focal neurologic deficits are appreciated. Skin: Skin is warm, dry and intact. No rash noted. Psychiatric: Mood and affect are normal. Speech and behavior are normal.  ____________________________________________   LABS (all labs ordered are listed, but only abnormal results are displayed)  Labs Reviewed  CBC WITH DIFFERENTIAL/PLATELET - Abnormal; Notable for the following:       Result Value   Hemoglobin 9.9 (*)    HCT 30.4 (*)    MCV 72.4 (*)    MCH 23.5 (*)    RDW 15.7 (*)    All other components within normal limits  COMPREHENSIVE METABOLIC PANEL - Abnormal; Notable for the following:    Glucose, Bld 117 (*)    ALT 12 (*)    Total Bilirubin 0.2 (*)    All other components within normal limits  URINALYSIS, COMPLETE (UACMP) WITH MICROSCOPIC - Abnormal; Notable for the following:    Color, Urine AMBER (*)    APPearance HAZY (*)    Hgb urine dipstick MODERATE (*)     Ketones, ur 5 (*)    Protein, ur 30 (*)    Leukocytes, UA MODERATE (*)    Bacteria, UA FEW (*)    Squamous Epithelial / LPF 6-30 (*)    All other components within normal limits  URINE CULTURE  FIBRIN DERIVATIVES D-DIMER ([facility] ONLY)  BRAIN NATRIURETIC PEPTIDE  TSH  POC URINE PREG, ED  POCT PREGNANCY, URINE   ____________________________________________  EKG  ED ECG REPORT I, Rudene Re, the attending physician, personally viewed and interpreted this ECG.  Sinus tachycardia, rate of 100, normal intervals, normal axis, no ST elevations or depressions. ____________________________________________  RADIOLOGY  CXR: negative  Doppler US; negative  ____________________________________________   PROCEDURES  Procedure(s) performed: None Procedures Critical Care performed:  None ____________________________________________   INITIAL IMPRESSION / ASSESSMENT AND PLAN / ED COURSE   31 y.o. female the history of asthma who presents for evaluation of bilateral leg swelling and shortness of breath x several months. Patient is PERC positive due to tachycardia therefore will get Doppler studies of b/l LE to rule out DVT as etiology of her leg swelling and d-dimer to rule out PE. Lungs are clear, vitals are normal. No asymmetric edema or pitting edema of her lower extremities. Blood work consistent with microcytic anemia. Patient tells me that she has very heavy menstrual periods. She says she started spotting today and she is due for her menstrual period at this time. Her hemoglobin is 9.9 (13.1 in 2014). No active bleeding. I do believe her SOB is mostly due to her anemia. No evidence of asthma exacerbation. BNP negative and CXR with no evidence of PNA or edema. Leg swelling is most likely due to long periods of standing.     _________________________ 8:06 PM on 07/08/2017 -----------------------------------------  D-dimer negative, Doppler studies were no DVT, BNP within normal  limits, thyroid studies negative. I do believe at this time the patient's leg swelling is due to venous stasis from standing for several hours and I recommended that she uses compression stockings. Also believe that her shortness of breath is due to her chronic iron deficiency anemia. Recommended she start on iron supplementation. Patient will follow-up with her primary care doctor in 2-3 days. She was started on Macrobid for UTI. Discussed return precautions for any signs of worsening anemia.  Pertinent labs & imaging results that were available during my care of the patient were reviewed by me and considered in my medical decision making (see chart for details).    ____________________________________________   FINAL CLINICAL IMPRESSION(S) / ED DIAGNOSES  Final diagnoses:  Venous stasis  Iron deficiency anemia due to chronic blood loss  Acute cystitis with hematuria      NEW MEDICATIONS STARTED DURING THIS VISIT:  New Prescriptions   FERROUS SULFATE (IRON) 325 (65 FE) MG TABS    Take 1 tablet (325 mg total) by mouth daily.   NITROFURANTOIN, MACROCRYSTAL-MONOHYDRATE, (MACROBID) 100 MG CAPSULE    Take 1 capsule (100 mg total) by mouth 2 (two) times daily.     Note:  This document was prepared using Dragon voice recognition software and may include unintentional dictation errors. Sinus tachycardia, rate of 100, normal intervals, normal axis, no ST elevations or depressions.   Alfred Levins, [place], MD 07/08/17 2008

## 2017-07-08 NOTE — ED Triage Notes (Signed)
Pt states that she has been having swelling in her legs for several months. Pt states that she lived in [place] x 1 year and noticed that her legs were swelling more when she was there. Pt noticed that recently that the left leg is swollen worse than the right. Pt denies pain or redness in the leg. Pt states that she is also having vaginal discharge and shortness of breath as well.   Pt is able to talk in complete sentences in triage and does not appear to be in any distress at this time.

## 2017-07-08 NOTE — Discharge Instructions (Signed)
Use compression stockings. Take iron every day. Take antibiotics as prescribed. Follow up with your primary care doctor in 2 days. Return to the emergency room for worsening shortness of breath, chest pain, abdominal pain, feeling faint, flank pain, fever, nausea or vomiting.

## 2017-07-10 LAB — URINE CULTURE

## 2018-12-10 ENCOUNTER — Encounter: Payer: Self-pay | Admitting: Family Medicine

## 2018-12-10 ENCOUNTER — Ambulatory Visit (INDEPENDENT_AMBULATORY_CARE_PROVIDER_SITE_OTHER): Payer: 59 | Admitting: Family Medicine

## 2018-12-10 DIAGNOSIS — F419 Anxiety disorder, unspecified: Secondary | ICD-10-CM | POA: Diagnosis not present

## 2018-12-10 MED ORDER — BUSPIRONE HCL 7.5 MG PO TABS: 7.5000 mg | ORAL_TABLET | Freq: Two times a day (BID) | ORAL | 0 refills | Status: DC

## 2018-12-10 MED ORDER — HYDROXYZINE HCL 25 MG PO TABS: 25.0000 mg | ORAL_TABLET | Freq: Three times a day (TID) | ORAL | 0 refills | Status: DC | PRN

## 2018-12-10 NOTE — Patient Instructions (Signed)
Buspirone tablets What is this medicine? BUSPIRONE (byoo SPYE rone) is used to treat anxiety disorders. This medicine may be used for other purposes; ask your health care provider or pharmacist if you have questions. COMMON BRAND NAME(S): BuSpar What should I tell my health care provider before I take this medicine? They need to know if you have any of these conditions: -kidney or liver disease -an unusual or allergic reaction to buspirone, other medicines, foods, dyes, or preservatives -pregnant or trying to get pregnant -breast-feeding How should I use this medicine? Take this medicine by mouth with a glass of water. Follow the directions on the prescription label. You may take this medicine with or without food. To ensure that this medicine always works the same way for you, you should take it either always with or always without food. Take your doses at regular intervals. Do not take your medicine more often than directed. Do not stop taking except on the advice of your doctor or health care professional. Talk to your pediatrician regarding the use of this medicine in children. Special care may be needed. Overdosage: If you think you have taken too much of this medicine contact a poison control center or emergency room at once. NOTE: This medicine is only for you. Do not share this medicine with others. What if I miss a dose? If you miss a dose, take it as soon as you can. If it is almost time for your next dose, take only that dose. Do not take double or extra doses. What may interact with this medicine? Do not take this medicine with any of the following medications: -linezolid -MAOIs like Carbex, Eldepryl, Marplan, Nardil, and Parnate -methylene blue -procarbazine This medicine may also interact with the following medications: -diazepam -digoxin -diltiazem -erythromycin -grapefruit juice -haloperidol -medicines for mental depression or mood problems -medicines for seizures like  carbamazepine, phenobarbital and phenytoin -nefazodone -other medications for anxiety -rifampin -ritonavir -some antifungal medicines like itraconazole, ketoconazole, and voriconazole -verapamil -warfarin This list may not describe all possible interactions. Give your health care provider a list of all the medicines, herbs, non-prescription drugs, or dietary supplements you use. Also tell them if you smoke, drink alcohol, or use illegal drugs. Some items may interact with your medicine. What should I watch for while using this medicine? Visit your doctor or health care professional for regular checks on your progress. It may take 1 to 2 weeks before your anxiety gets better. You may get drowsy or dizzy. Do not drive, use machinery, or do anything that needs mental alertness until you know how this drug affects you. Do not stand or sit up quickly, especially if you are an older patient. This reduces the risk of dizzy or fainting spells. Alcohol can make you more drowsy and dizzy. Avoid alcoholic drinks. What side effects may I notice from receiving this medicine? Side effects that you should report to your doctor or health care professional as soon as possible: -blurred vision or other vision changes -chest pain -confusion -difficulty breathing -feelings of hostility or anger -muscle aches and pains -numbness or tingling in hands or feet -ringing in the ears -skin rash and itching -vomiting -weakness Side effects that usually do not require medical attention (report to your doctor or health care professional if they continue or are bothersome): -disturbed dreams, nightmares -headache -nausea -restlessness or nervousness -sore throat and nasal congestion -stomach upset This list may not describe all possible side effects. Call your doctor for medical advice about side   effects. You may report side effects to FDA at 1-800-FDA-1088. Where should I keep my medicine? Keep out of the reach  of children. Store at room temperature below 30 degrees C (86 degrees F). Protect from light. Keep container tightly closed. Throw away any unused medicine after the expiration date. NOTE: This sheet is a summary. It may not cover all possible information. If you have questions about this medicine, talk to your doctor, pharmacist, or health care provider.  2019 Elsevier/Gold Standard (2010-06-09 18:06:11) Hydroxyzine capsules or tablets What is this medicine? HYDROXYZINE (hye [place] i zeen) is an antihistamine. This medicine is used to treat allergy symptoms. It is also used to treat anxiety and tension. This medicine can be used with other medicines to induce sleep before surgery. This medicine may be used for other purposes; ask your health care provider or pharmacist if you have questions. COMMON BRAND NAME(S): ANX, Atarax, Rezine, Vistaril What should I tell my health care provider before I take this medicine? They need to know if you have any of these conditions: -glaucoma -heart disease -history of irregular heartbeat -kidney disease -liver disease -lung or breathing disease, like asthma -stomach or intestine problems -thyroid disease -trouble passing urine -an unusual or allergic reaction to hydroxyzine, cetirizine, other medicines, foods, dyes or preservatives -pregnant or trying to get pregnant -breast-feeding How should I use this medicine? Take this medicine by mouth with a full glass of water. Follow the directions on the prescription label. You may take this medicine with food or on an empty stomach. Take your medicine at regular intervals. Do not take your medicine more often than directed. Talk to your pediatrician regarding the use of this medicine in children. Special care may be needed. While this drug may be prescribed for children as young as 12 years of age for selected conditions, precautions do apply. Patients over 62 years old may have a stronger reaction and need a  smaller dose. Overdosage: If you think you have taken too much of this medicine contact a poison control center or emergency room at once. NOTE: This medicine is only for you. Do not share this medicine with others. What if I miss a dose? If you miss a dose, take it as soon as you can. If it is almost time for your next dose, take only that dose. Do not take double or extra doses. What may interact with this medicine? Do not take this medicine with any of the following medications: -cisapride -dofetilide -dronedarone -pimozide -thioridazine This medicine may also interact with the following medications: -alcohol -antihistamines for allergy, cough, and cold -atropine -barbiturate medicines for sleep or seizures, like phenobarbital -certain antibiotics like erythromycin or clarithromycin -certain medicines for anxiety or sleep -certain medicines for bladder problems like oxybutynin, tolterodine -certain medicines for depression or psychotic disturbances -certain medicines for irregular heart beat -certain medicines for Parkinson's disease like benztropine, trihexyphenidyl -certain medicines for seizures like phenobarbital, primidone -certain medicines for stomach problems like dicyclomine, hyoscyamine -certain medicines for travel sickness like scopolamine -ipratropium -narcotic medicines for pain -other medicines that prolong the QT interval (an abnormal heart rhythm) This list may not describe all possible interactions. Give your health care provider a list of all the medicines, herbs, non-prescription drugs, or dietary supplements you use. Also tell them if you smoke, drink alcohol, or use illegal drugs. Some items may interact with your medicine. What should I watch for while using this medicine? Tell your doctor or health care professional if your symptoms do  not improve. You may get drowsy or dizzy. Do not drive, use machinery, or do anything that needs mental alertness until you  know how this medicine affects you. Do not stand or sit up quickly, especially if you are an older patient. This reduces the risk of dizzy or fainting spells. Alcohol may interfere with the effect of this medicine. Avoid alcoholic drinks. Your mouth may get dry. Chewing sugarless gum or sucking hard candy, and drinking plenty of water may help. Contact your doctor if the problem does not go away or is severe. This medicine may cause dry eyes and blurred vision. If you wear contact lenses you may feel some discomfort. Lubricating drops may help. See your eye doctor if the problem does not go away or is severe. If you are receiving skin tests for allergies, tell your doctor you are using this medicine. What side effects may I notice from receiving this medicine? Side effects that you should report to your doctor or health care professional as soon as possible: -allergic reactions like skin rash, itching or hives, swelling of the face, lips, or tongue -changes in vision -confusion -fast, irregular heartbeat -seizures -tremor -trouble passing urine or change in the amount of urine Side effects that usually do not require medical attention (report to your doctor or health care professional if they continue or are bothersome): -constipation -drowsiness -dry mouth -headache -tiredness This list may not describe all possible side effects. Call your doctor for medical advice about side effects. You may report side effects to FDA at 1-800-FDA-1088. Where should I keep my medicine? Keep out of the reach of children. Store at room temperature between 15 and 30 degrees C (59 and 86 degrees F). Keep container tightly closed. Throw away any unused medicine after the expiration date. NOTE: This sheet is a summary. It may not cover all possible information. If you have questions about this medicine, talk to your doctor, pharmacist, or health care provider.  2019 Elsevier/Gold Standard (2018-05-13  13:25:13)

## 2018-12-10 NOTE — Progress Notes (Signed)
BP 107/74 (BP Location: Right Arm, Patient Position: Sitting, Cuff Size: Normal)   Pulse 89   Temp 98 F (36.7 C) (Oral)   Ht 5\' 2"  (1.575 m)   Wt 122 lb 8 oz (55.6 kg)   SpO2 98%   BMI 22.41 kg/m    Subjective:    Patient ID: Laura Morton, female    DOB: [birth year], 33 y.o.   MRN: [id number]  HPI: Laura Morton is a 33 y.o. female  Chief Complaint  Patient presents with  . Anxiety    Husband is currently in [place], in the process of immigration process.   . Panic Attack   Very stressed right now as they are dealing with immigration for her husband from [place]. SOB, dizziness, extremity numbness, chest tightness during panic episodes. Had 3 just yesterday. Feels like this has been ongoing for a few months but gotten worse the last few weeks. Was given ativan for flights in 2017, taking half a pill as needed but it makes her feel loopy. Does not want something like this, wanting a daily medicine. Denies depressed moods, SI/HI. Has never been on anything else for anxiety in the past.   Depression screen [facility] 2/9 12/10/2018  Decreased Interest 0  Down, Depressed, Hopeless 0  PHQ - 2 Score 0  Altered sleeping 1  Tired, decreased energy 0  Change in appetite 2  Feeling bad or failure about yourself  0  Trouble concentrating 0  Moving slowly or fidgety/restless 0  Suicidal thoughts 0  PHQ-9 Score 3   GAD 7 : Generalized Anxiety Score 12/10/2018  Nervous, Anxious, on Edge 2  Control/stop worrying 2  Worry too much - different things 2  Trouble relaxing 1  Restless 0  Easily annoyed or irritable 0  Afraid - awful might happen 0  Total GAD 7 Score 7  Anxiety Difficulty Somewhat difficult     Relevant past medical, surgical, family and social history reviewed and updated as indicated. Interim medical history since our last visit reviewed. Allergies and medications reviewed and updated.  Review of Systems  Per HPI unless specifically indicated above     Objective:    BP  107/74 (BP Location: Right Arm, Patient Position: Sitting, Cuff Size: Normal)   Pulse 89   Temp 98 F (36.7 C) (Oral)   Ht 5\' 2"  (1.575 m)   Wt 122 lb 8 oz (55.6 kg)   SpO2 98%   BMI 22.41 kg/m   Wt Readings from Last 3 Encounters:  12/10/18 122 lb 8 oz (55.6 kg)  01/07/16 105 lb (47.6 kg)  12/15/15 110 lb 12.8 oz (50.3 kg)    Physical Exam Vitals signs and nursing note reviewed.  Constitutional:      Appearance: Normal appearance. She is not ill-appearing.  HENT:     Head: Atraumatic.  Eyes:     Extraocular Movements: Extraocular movements intact.     Conjunctiva/sclera: Conjunctivae normal.  Neck:     Musculoskeletal: Normal range of motion and neck supple.  Cardiovascular:     Rate and Rhythm: Normal rate and regular rhythm.     Heart sounds: Normal heart sounds.  Pulmonary:     Effort: Pulmonary effort is normal.     Breath sounds: Normal breath sounds.  Musculoskeletal: Normal range of motion.  Skin:    General: Skin is warm and dry.  Neurological:     Mental Status: She is alert and oriented to person, place, and time.  Psychiatric:  Mood and Affect: Mood normal.        Thought Content: Thought content normal.        Judgment: Judgment normal.     Results for orders placed or performed during the hospital encounter of 07/08/17  Urine Culture  Result Value Ref Range   Specimen Description URINE, RANDOM    Special Requests NONE    Culture MULTIPLE SPECIES PRESENT, SUGGEST RECOLLECTION (A)    Report Status 07/10/2017 FINAL   CBC with Differential  Result Value Ref Range   WBC 5.0 3.6 - 11.0 K/uL   RBC 4.19 3.80 - 5.20 MIL/uL   Hemoglobin 9.9 (L) 12.0 - 16.0 g/dL   HCT 30.4 (L) 35.0 - 47.0 %   MCV 72.4 (L) 80.0 - 100.0 fL   MCH 23.5 (L) 26.0 - 34.0 pg   MCHC 32.4 32.0 - 36.0 g/dL   RDW 15.7 (H) 11.5 - 14.5 %   Platelets 367 150 - 440 K/uL   Neutrophils Relative % 57 %   Neutro Abs 2.8 1.4 - 6.5 K/uL   Lymphocytes Relative 29 %   Lymphs Abs 1.4  1.0 - 3.6 K/uL   Monocytes Relative 10 %   Monocytes Absolute 0.5 0.2 - 0.9 K/uL   Eosinophils Relative 3 %   Eosinophils Absolute 0.1 0 - 0.7 K/uL   Basophils Relative 1 %   Basophils Absolute 0.0 0 - 0.1 K/uL  Comprehensive metabolic panel  Result Value Ref Range   Sodium 139 135 - 145 mmol/L   Potassium 3.9 3.5 - 5.1 mmol/L   Chloride 110 101 - 111 mmol/L   CO2 24 22 - 32 mmol/L   Glucose, Bld 117 (H) 65 - 99 mg/dL   BUN 9 6 - 20 mg/dL   Creatinine, Ser 0.54 0.44 - 1.00 mg/dL   Calcium 9.2 8.9 - 10.3 mg/dL   Total Protein 7.4 6.5 - 8.1 g/dL   Albumin 4.2 3.5 - 5.0 g/dL   AST 20 15 - 41 U/L   ALT 12 (L) 14 - 54 U/L   Alkaline Phosphatase 51 38 - 126 U/L   Total Bilirubin 0.2 (L) 0.3 - 1.2 mg/dL   GFR calc non Af Amer >60 >60 mL/min   GFR calc Af Amer >60 >60 mL/min   Anion gap 5 5 - 15  Urinalysis, Complete w Microscopic  Result Value Ref Range   Color, Urine AMBER (A) YELLOW   APPearance HAZY (A) CLEAR   Specific Gravity, Urine 1.027 1.005 - 1.030   pH 5.0 5.0 - 8.0   Glucose, UA NEGATIVE NEGATIVE mg/dL   Hgb urine dipstick MODERATE (A) NEGATIVE   Bilirubin Urine NEGATIVE NEGATIVE   Ketones, ur 5 (A) NEGATIVE mg/dL   Protein, ur 30 (A) NEGATIVE mg/dL   Nitrite NEGATIVE NEGATIVE   Leukocytes, UA MODERATE (A) NEGATIVE   RBC / HPF 0-5 0 - 5 RBC/hpf   WBC, UA 6-30 0 - 5 WBC/hpf   Bacteria, UA FEW (A) NONE SEEN   Squamous Epithelial / LPF 6-30 (A) NONE SEEN   Mucus PRESENT   Fibrin derivatives D-Dimer ([facility] only)  Result Value Ref Range   Fibrin derivatives D-dimer ([facility]) 157.89 0.00 - 499.00  Brain natriuretic peptide  Result Value Ref Range   B Natriuretic Peptide 50.0 0.0 - 100.0 pg/mL  TSH  Result Value Ref Range   TSH 3.721 0.350 - 4.500 uIU/mL  Pregnancy, urine POC  Result Value Ref Range   Preg Test, Ur NEGATIVE  NEGATIVE      Assessment & Plan:   Problem List Items Addressed This Visit      Other   Anxiety    Agreeable to starting buspar and prn  hydroxyzine. Continue to monitor for benefit, counseling packet given and pt to call if she decides she's interested in counseling      Relevant Medications   busPIRone (BUSPAR) 7.5 MG tablet   hydrOXYzine (ATARAX/VISTARIL) 25 MG tablet       Follow up plan: Return in about 4 weeks (around 01/07/2019) for Anxiety.

## 2018-12-11 DIAGNOSIS — F419 Anxiety disorder, unspecified: Secondary | ICD-10-CM | POA: Insufficient documentation

## 2018-12-11 NOTE — Assessment & Plan Note (Signed)
Agreeable to starting buspar and prn hydroxyzine. Continue to monitor for benefit, counseling packet given and pt to call if she decides she's interested in counseling

## 2018-12-20 ENCOUNTER — Ambulatory Visit: Payer: Self-pay

## 2018-12-20 NOTE — Telephone Encounter (Signed)
Pt called with medication question.. she states she has not started to take Buspar or Hydroxyzine.  She does not want to take anything that she can get hook on.  She states she has talked to the therapist she was set up with and that she recommended another therapist that can order medications. Marguerita Beards in [place]. Her appointment is set for 12/31/2018. Pt is requesting  an RX for Lorazepam.  She states she has an old RX for this and she just takes 1/4 tab and is able to work and function without the panic attacks.  She states she is home today because of a bad panic attach today.  She has taken no medication today.   Reason for Disposition . Caller has URGENT medication question about med that PCP prescribed and triager unable to answer question  Answer Assessment - Initial Assessment Questions 1. SYMPTOMS: "Do you have any symptoms?"     anxiety 2. SEVERITY: If symptoms are present, ask "Are they mild, moderate or severe?"     Pt does not want to take Buspar or Hydroxyzine.  She has seen a therapist and is now going to see a therapist that can prescribe. Scheduled for 12/31/2018 Marguerita Beards She would like to take Ativan On past medication list.  Pt took 1/4 tablet 2 days ago.  Protocols used: MEDICATION QUESTION CALL-A-AH

## 2018-12-21 NOTE — Telephone Encounter (Signed)
As discussed at length during OV, lorazepam is highly addictive and has serious risks associated which is why we were trying SAFER NON ADDICTIVE options such as the buspar and hydroxyzine. It it completely her right to decide not to take them and to seek a second opinion, but I will not refill her lorazepam.

## 2018-12-23 ENCOUNTER — Ambulatory Visit (INDEPENDENT_AMBULATORY_CARE_PROVIDER_SITE_OTHER): Payer: 59 | Admitting: Family Medicine

## 2018-12-23 ENCOUNTER — Encounter: Payer: Self-pay | Admitting: Family Medicine

## 2018-12-23 VITALS — BP 101/69 | HR 76 | Temp 98.4°F | Ht 62.0 in | Wt 121.0 lb

## 2018-12-23 DIAGNOSIS — F419 Anxiety disorder, unspecified: Secondary | ICD-10-CM

## 2018-12-23 NOTE — Telephone Encounter (Signed)
Spoke with patient. She stated that she understood. She would wait until she saw new psychiatrist on the 18th. However, pt states that she is having difficulty working w/ anxiety. Patient went to work today and was sent home. Pt requesting letter stating she is unable to work until the 18th. Patient scheduled to discuss with provider. Will be in this afternoon.

## 2018-12-23 NOTE — Progress Notes (Signed)
BP 101/69 (BP Location: Right Arm, Patient Position: Sitting, Cuff Size: Normal)   Pulse 76   Temp 98.4 F (36.9 C) (Oral)   Ht 5\' 2"  (1.575 m)   Wt 121 lb (54.9 kg)   SpO2 98%   BMI 22.13 kg/m    Subjective:    Patient ID: Laura Morton, female    DOB: 10-Oct-1986, 33 y.o.   MRN: [id number]  HPI: Laura Morton is a 33 y.o. female  Chief Complaint  Patient presents with  . Panic Attack    Needs to discuss note for work.    Here today with significantly worsening anxiety that is keeping her from being able to work. States she's having 2-3 panic attacks daily recently and work seems to be a major trigger. Was previously on prn ativan which has always seemed to help but makes her groggy. Was given buspar and hydroxyzine last week but decided not to take them because she's worried about getting addicted to the medicines. Did start seeing a therapist, who recommended she see Psychiatry. She has an appt scheduled for 12/31/18. Denies mood swings, SI/HI, depression, sleep disturbances.   Relevant past medical, surgical, family and social history reviewed and updated as indicated. Interim medical history since our last visit reviewed. Allergies and medications reviewed and updated.  Review of Systems  Per HPI unless specifically indicated above     Objective:    BP 101/69 (BP Location: Right Arm, Patient Position: Sitting, Cuff Size: Normal)   Pulse 76   Temp 98.4 F (36.9 C) (Oral)   Ht 5\' 2"  (1.575 m)   Wt 121 lb (54.9 kg)   SpO2 98%   BMI 22.13 kg/m   Wt Readings from Last 3 Encounters:  12/23/18 121 lb (54.9 kg)  12/10/18 122 lb 8 oz (55.6 kg)  01/07/16 105 lb (47.6 kg)    Physical Exam Vitals signs and nursing note reviewed.  Constitutional:      Appearance: Normal appearance. She is not ill-appearing.  HENT:     Head: Atraumatic.  Eyes:     Extraocular Movements: Extraocular movements intact.     Conjunctiva/sclera: Conjunctivae normal.  Neck:     Musculoskeletal:  Normal range of motion and neck supple.  Cardiovascular:     Rate and Rhythm: Normal rate and regular rhythm.     Heart sounds: Normal heart sounds.  Pulmonary:     Effort: Pulmonary effort is normal.     Breath sounds: Normal breath sounds.  Musculoskeletal: Normal range of motion.  Skin:    General: Skin is warm and dry.  Neurological:     Mental Status: She is alert and oriented to person, place, and time.  Psychiatric:        Thought Content: Thought content normal.        Judgment: Judgment normal.     Comments: tearful     Results for orders placed or performed in visit on 12/23/18  TSH  Result Value Ref Range   TSH 2.280 0.450 - 4.500 uIU/mL  Vitamin B12  Result Value Ref Range   Vitamin B-12 593 232 - 1,245 pg/mL  CBC with Differential/Platelet  Result Value Ref Range   WBC 4.7 3.4 - 10.8 x10E3/uL   RBC 4.77 3.77 - 5.28 x10E6/uL   Hemoglobin 13.6 11.1 - 15.9 g/dL   Hematocrit 40.3 34.0 - 46.6 %   MCV 85 79 - 97 fL   MCH 28.5 26.6 - 33.0 pg   MCHC 33.7 31.5 -  35.7 g/dL   RDW 12.8 11.7 - 15.4 %   Platelets 280 150 - 450 x10E3/uL   Neutrophils 66 Not Estab. %   Lymphs 24 Not Estab. %   Monocytes 7 Not Estab. %   Eos 2 Not Estab. %   Basos 1 Not Estab. %   Neutrophils Absolute 3.1 1.4 - 7.0 x10E3/uL   Lymphocytes Absolute 1.2 0.7 - 3.1 x10E3/uL   Monocytes Absolute 0.3 0.1 - 0.9 x10E3/uL   EOS (ABSOLUTE) 0.1 0.0 - 0.4 x10E3/uL   Basophils Absolute 0.0 0.0 - 0.2 x10E3/uL   Immature Granulocytes 0 Not Estab. %   Immature Grans (Abs) 0.0 0.0 - 0.1 x10E3/uL  Vitamin D (25 hydroxy)  Result Value Ref Range   Vit D, 25-Hydroxy 14.0 (L) 30.0 - 100.0 ng/mL      Assessment & Plan:   Problem List Items Addressed This Visit      Other   Anxiety - Primary    Pt requesting a leave from work until she can see Psychiatry. Agreeable after long discussion to try the buspar and hydroxyzine. FMLA form completed. Discussed walk in options in case things worsen prior to new  pt visit next week.       Relevant Orders   TSH (Completed)   Vitamin B12 (Completed)   CBC with Differential/Platelet (Completed)   Vitamin D (25 hydroxy) (Completed)      Greater than 25 min spent in direct patient care and coordination today.   Follow up plan: Return if symptoms worsen or fail to improve.

## 2018-12-24 LAB — CBC WITH DIFFERENTIAL/PLATELET
Basophils Absolute: 0 10*3/uL (ref 0.0–0.2)
Basos: 1 %
EOS (ABSOLUTE): 0.1 10*3/uL (ref 0.0–0.4)
Eos: 2 %
Hematocrit: 40.3 % (ref 34.0–46.6)
Hemoglobin: 13.6 g/dL (ref 11.1–15.9)
Immature Grans (Abs): 0 10*3/uL (ref 0.0–0.1)
Immature Granulocytes: 0 %
Lymphocytes Absolute: 1.2 10*3/uL (ref 0.7–3.1)
Lymphs: 24 %
MCH: 28.5 pg (ref 26.6–33.0)
MCHC: 33.7 g/dL (ref 31.5–35.7)
MCV: 85 fL (ref 79–97)
Monocytes Absolute: 0.3 10*3/uL (ref 0.1–0.9)
Monocytes: 7 %
Neutrophils Absolute: 3.1 10*3/uL (ref 1.4–7.0)
Neutrophils: 66 %
PLATELETS: 280 10*3/uL (ref 150–450)
RBC: 4.77 x10E6/uL (ref 3.77–5.28)
RDW: 12.8 % (ref 11.7–15.4)
WBC: 4.7 10*3/uL (ref 3.4–10.8)

## 2018-12-24 LAB — TSH: TSH: 2.28 u[IU]/mL (ref 0.450–4.500)

## 2018-12-24 LAB — VITAMIN D 25 HYDROXY (VIT D DEFICIENCY, FRACTURES): Vit D, 25-Hydroxy: 14 ng/mL — ABNORMAL LOW (ref 30.0–100.0)

## 2018-12-24 LAB — VITAMIN B12: Vitamin B-12: 593 pg/mL (ref 232–1245)

## 2018-12-25 IMAGING — CR DG CHEST 2V
1 series · 2 of 2 positions shown · non-contrast
Comparison: None.

CLINICAL DATA: Recent travel to [place]. LEFT leg swelling for 5
months. History of asthma.

EXAM:
CHEST  2 VIEW

[Series 1: dg chest 2 view · 0.14mm/px · 2 of 2 slices shown]
[im 1/2]
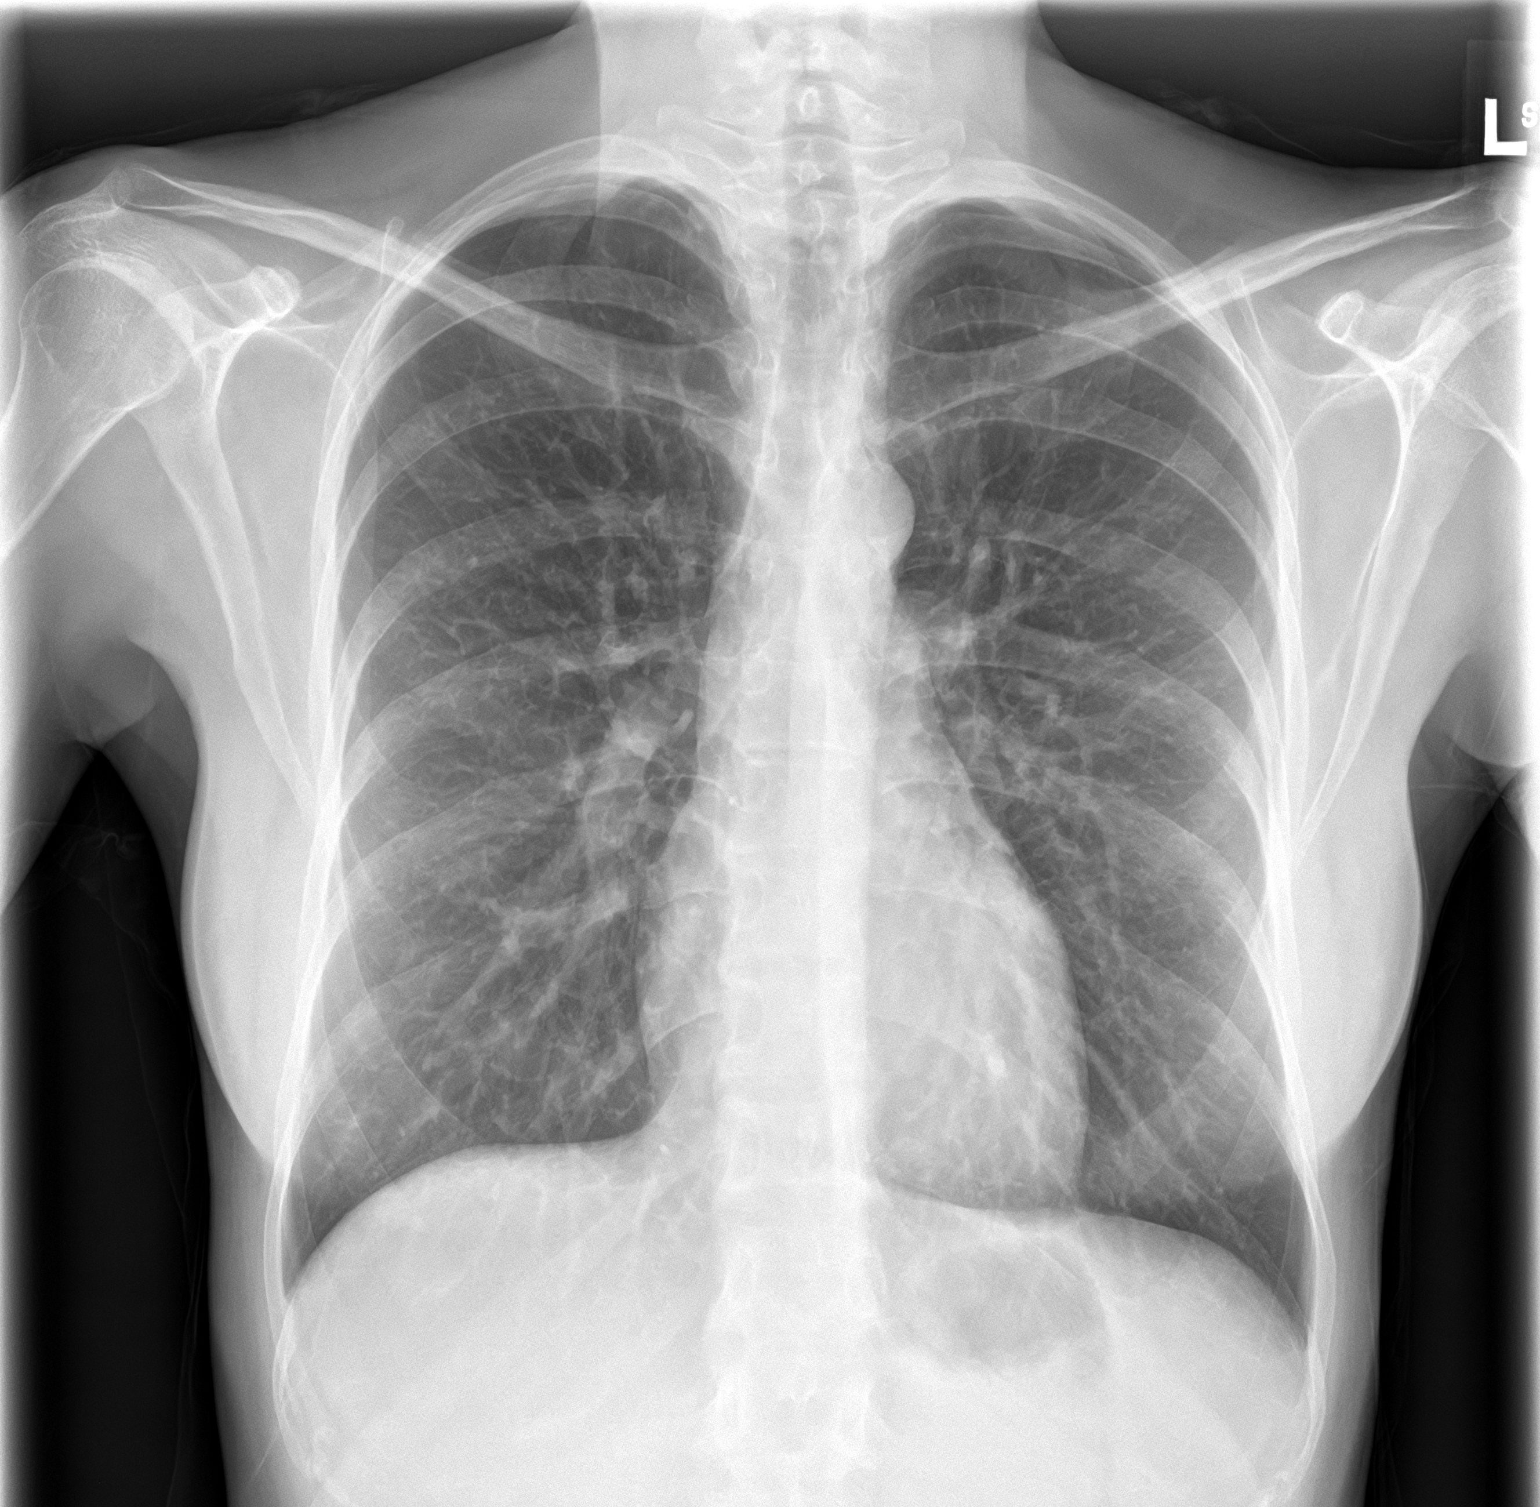
[im 2/2]
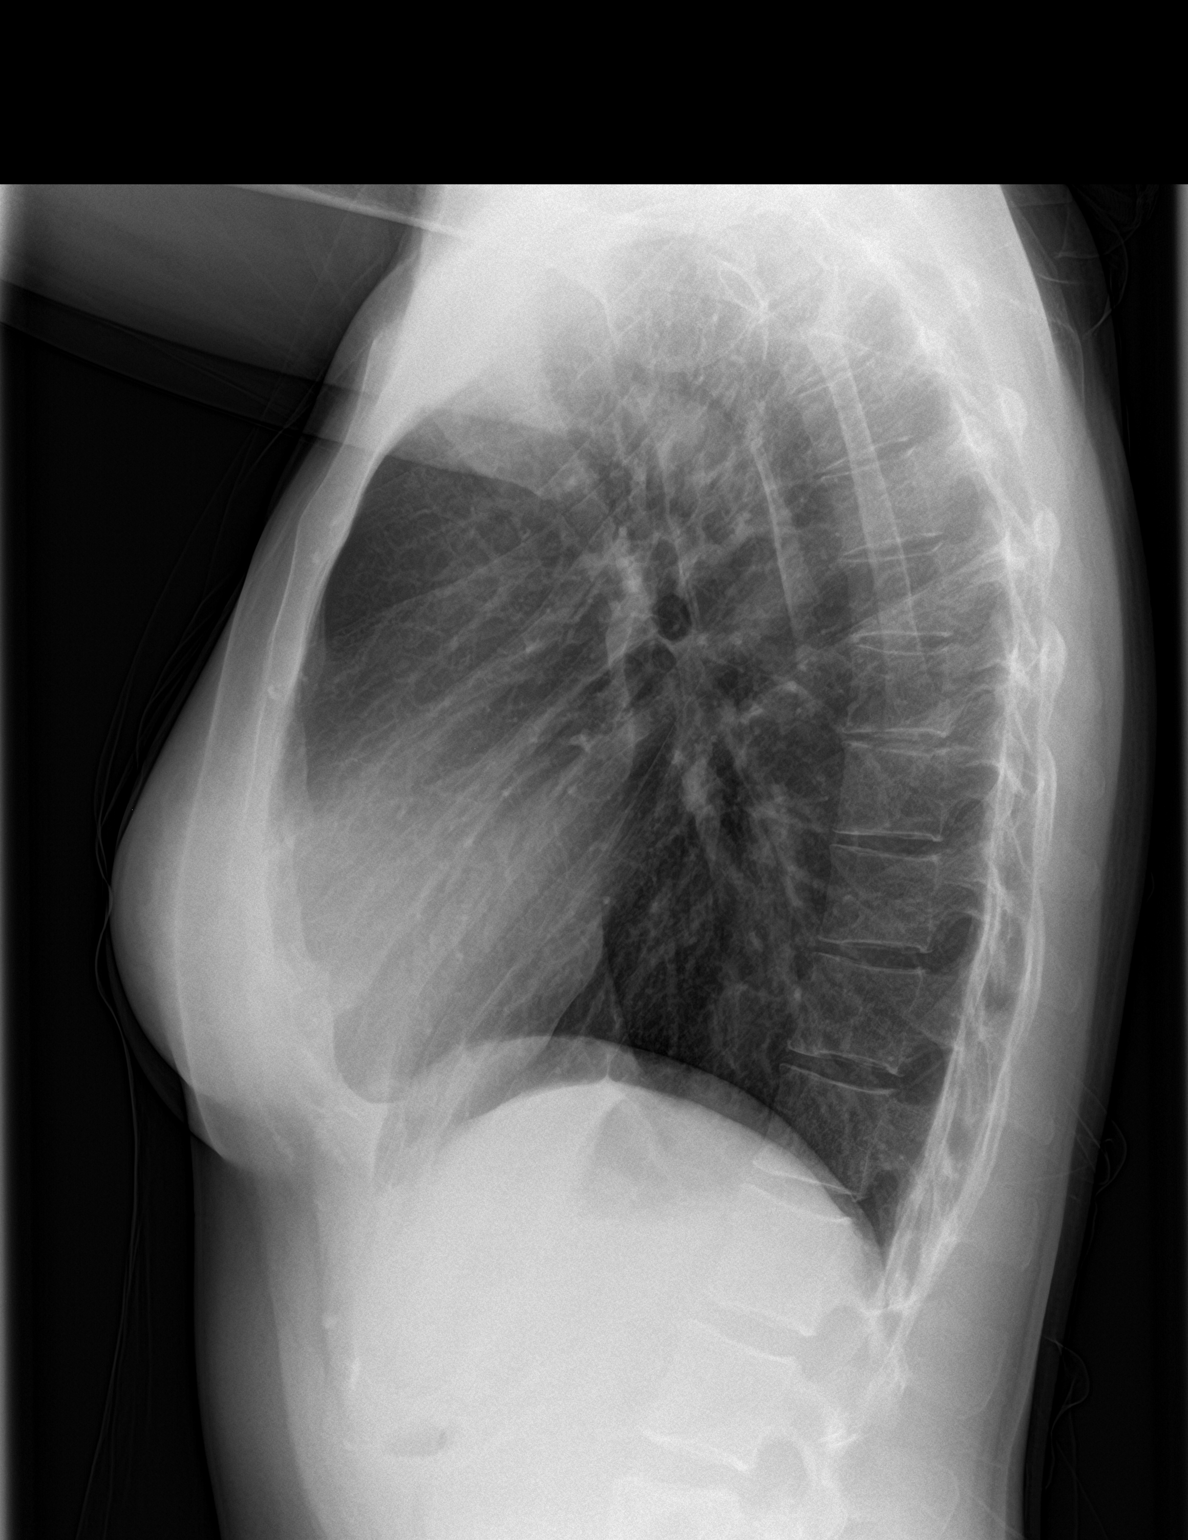

[2 of 2 positions shown; findings below may reference images not displayed]

FINDINGS: The heart size and mediastinal contours are within normal limits.
Both lungs are clear. The visualized skeletal structures are
unremarkable.
IMPRESSION: Normal chest.

## 2018-12-26 NOTE — Assessment & Plan Note (Signed)
Pt requesting a leave from work until she can see Psychiatry. Agreeable after long discussion to try the buspar and hydroxyzine. FMLA form completed. Discussed walk in options in case things worsen prior to new pt visit next week.

## 2018-12-31 DIAGNOSIS — Z79899 Other long term (current) drug therapy: Secondary | ICD-10-CM | POA: Diagnosis not present

## 2018-12-31 DIAGNOSIS — G4700 Insomnia, unspecified: Secondary | ICD-10-CM | POA: Diagnosis not present

## 2019-01-07 ENCOUNTER — Ambulatory Visit: Payer: 59 | Admitting: Family Medicine

## 2019-01-20 ENCOUNTER — Telehealth: Payer: Self-pay | Admitting: Family Medicine

## 2019-01-20 NOTE — Telephone Encounter (Signed)
That was completed last Tuesday - please make sure it got sent  Copied from [place] [phone]. Topic: General - Other >> Jan 16, 2019  4:54 PM Ivar Drape wrote: Reason for CRM:   Juliann Pulse w/[employer] [phone] ex [phone] wants to know if the FMLA paperwork for the patient was received and processed.  If it has there is no need to return the call.

## 2019-01-20 NOTE — Telephone Encounter (Signed)
Sharyn Blitz stated she gave this paperwork to you? Did you fax it or do you have it still?  Thanks!

## 2019-01-20 NOTE — Telephone Encounter (Signed)
Patient picked up the paperwork.

## 2019-01-28 DIAGNOSIS — G4700 Insomnia, unspecified: Secondary | ICD-10-CM | POA: Diagnosis not present

## 2019-01-28 DIAGNOSIS — Z79899 Other long term (current) drug therapy: Secondary | ICD-10-CM | POA: Diagnosis not present

## 2019-01-29 ENCOUNTER — Encounter: Payer: 59 | Admitting: Family Medicine

## 2019-02-10 IMAGING — US US EXTREM LOW VENOUS BILAT
1 series · 13 of 24 positions shown · non-contrast
Comparison: None.

CLINICAL DATA: Bilateral leg swelling for several months



[Series 1: us extrem low venous bilat · 0.06mm/px · 13 of 61 slices shown]
[im 1/61]
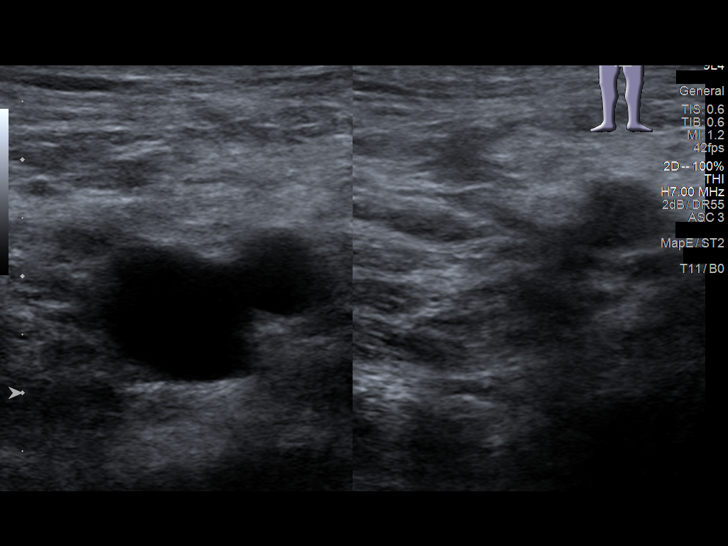
[im 6/61]
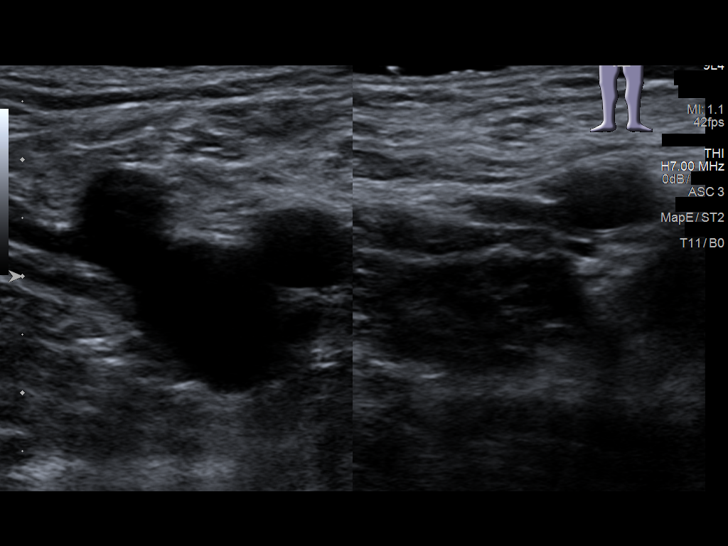
[im 11/61]
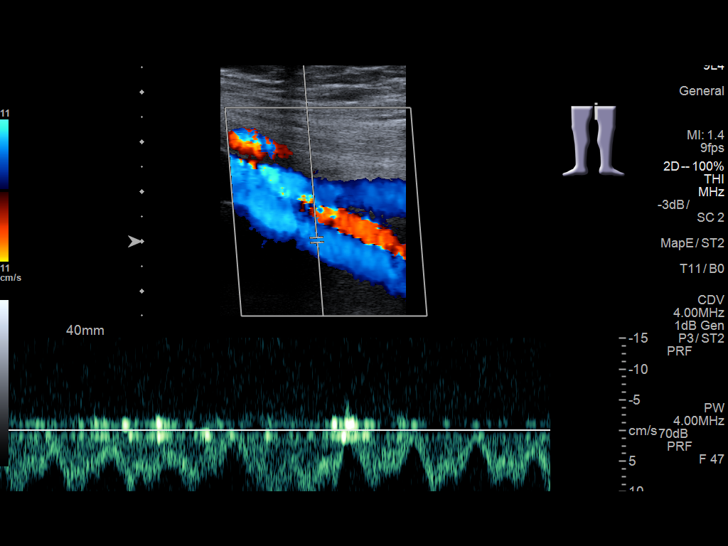
[im 16/61]
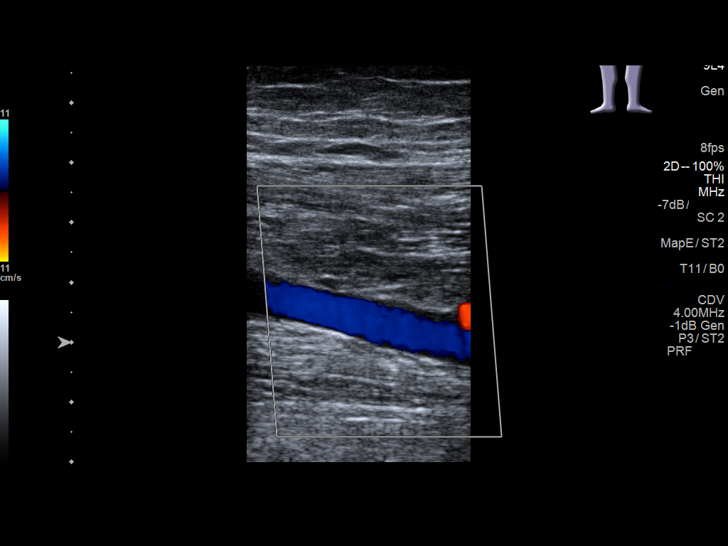
[im 21/61]
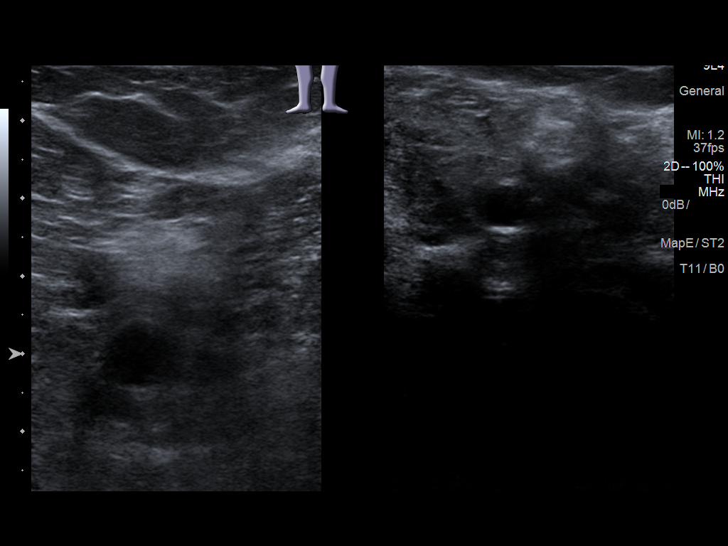
[im 27/61]
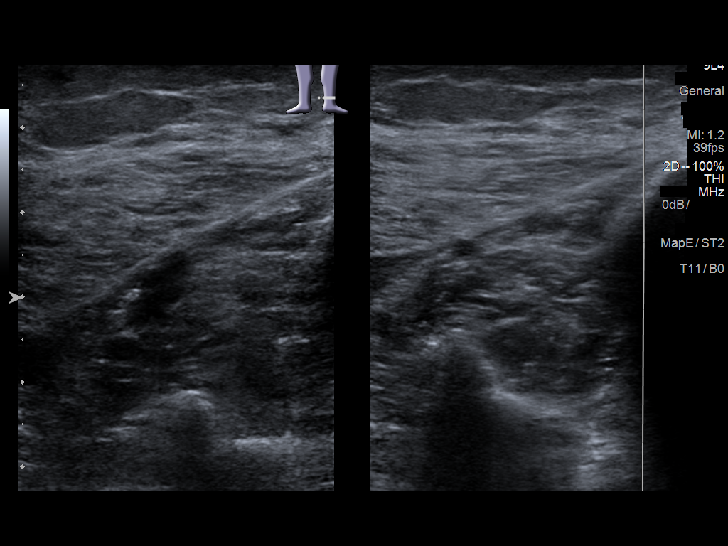
[im 32/61]
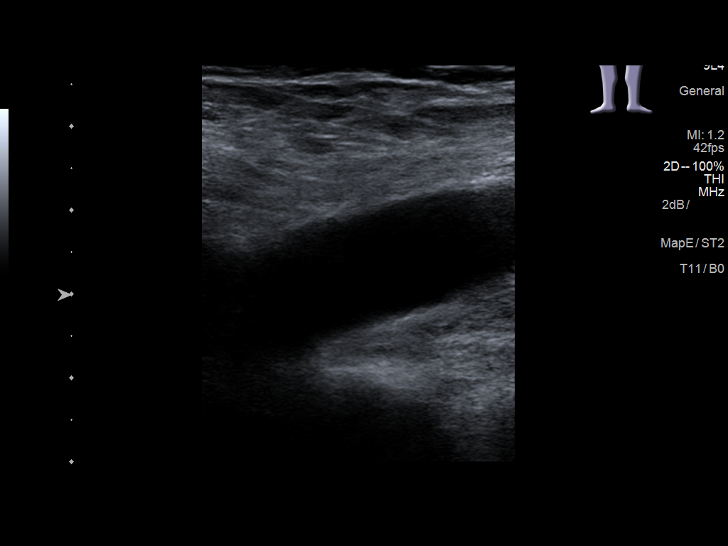
[im 34/61]
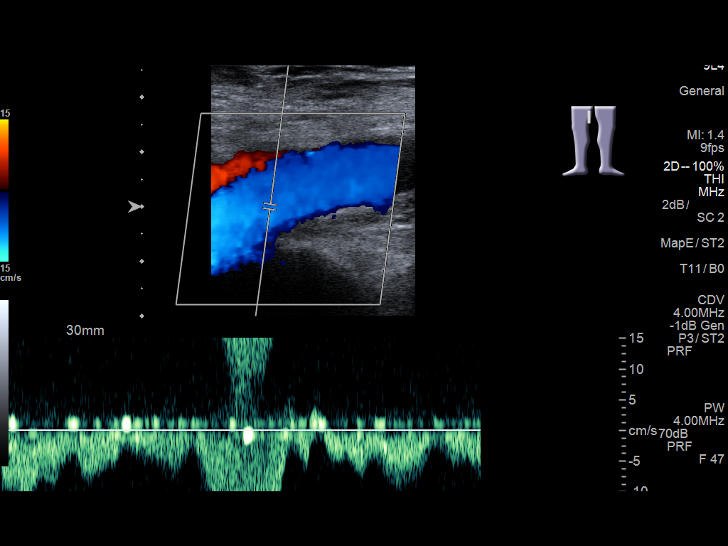
[im 40/61]
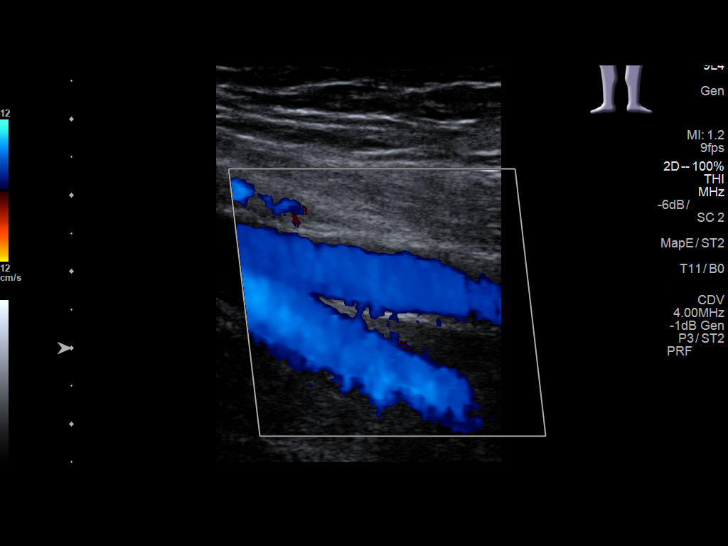
[im 45/61]
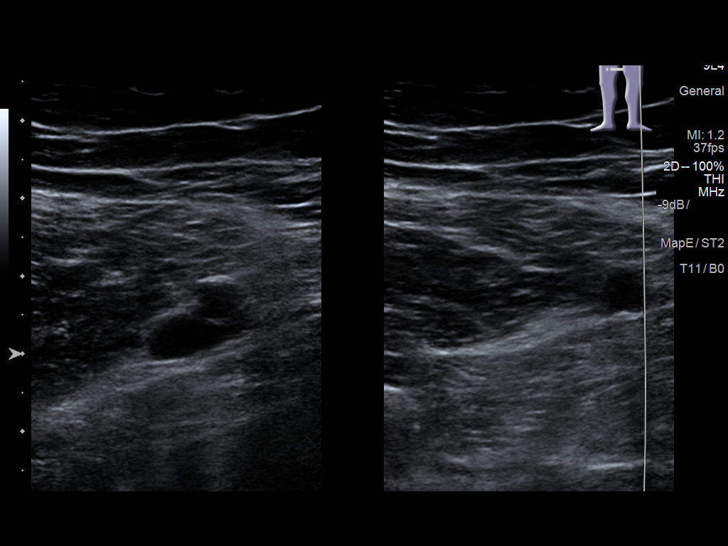
[im 50/61]
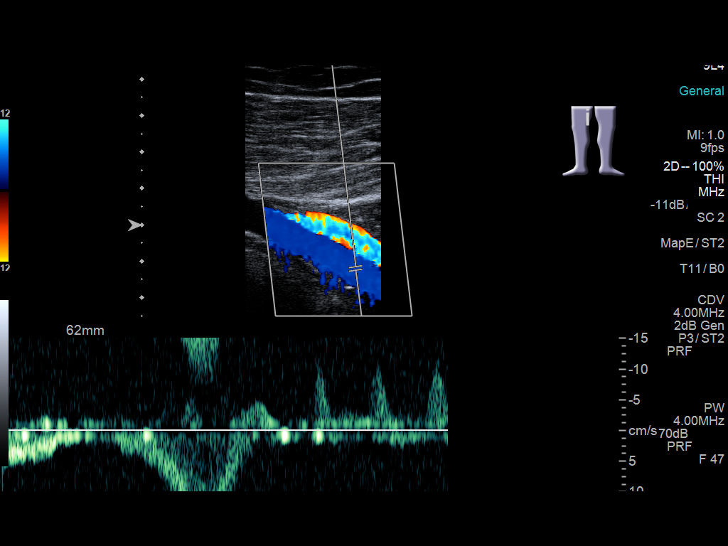
[im 55/61]
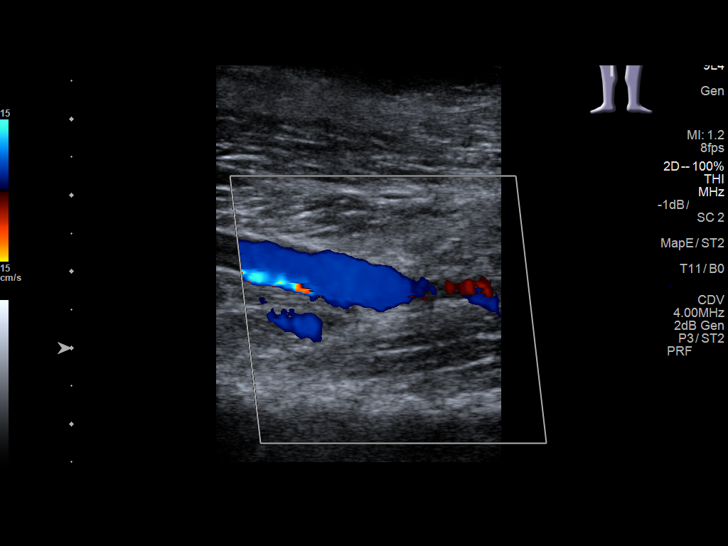
[im 61/61]
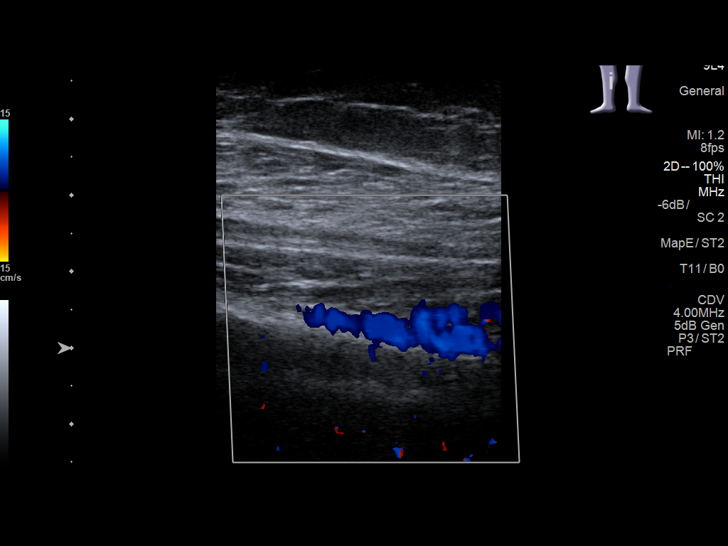

[13 of 24 positions shown; findings below may reference images not displayed]

FINDINGS: RIGHT LOWER EXTREMITY

Common Femoral Vein: No evidence of thrombus. Normal
compressibility, respiratory phasicity and response to augmentation.

Saphenofemoral Junction: No evidence of thrombus. Normal
compressibility and flow on color Doppler imaging.

Profunda Femoral Vein: No evidence of thrombus. Normal
compressibility and flow on color Doppler imaging.

Femoral Vein: No evidence of thrombus. Normal compressibility,
respiratory phasicity and response to augmentation.

Popliteal Vein: No evidence of thrombus. Normal compressibility,
respiratory phasicity and response to augmentation.

Calf Veins: No evidence of thrombus. Normal compressibility and flow
on color Doppler imaging.

Superficial Great Saphenous Vein: No evidence of thrombus. Normal
compressibility and flow on color Doppler imaging.

Venous Reflux:  None.

Other Findings:  None.

LEFT LOWER EXTREMITY

Common Femoral Vein: No evidence of thrombus. Normal
compressibility, respiratory phasicity and response to augmentation.

Saphenofemoral Junction: No evidence of thrombus. Normal
compressibility and flow on color Doppler imaging.

Profunda Femoral Vein: No evidence of thrombus. Normal
compressibility and flow on color Doppler imaging.

Femoral Vein: No evidence of thrombus. Normal compressibility,
respiratory phasicity and response to augmentation.

Popliteal Vein: No evidence of thrombus. Normal compressibility,
respiratory phasicity and response to augmentation.

Calf Veins: No evidence of thrombus. Normal compressibility and flow
on color Doppler imaging.

Superficial Great Saphenous Vein: No evidence of thrombus. Normal
compressibility and flow on color Doppler imaging.

Venous Reflux:  None.

Other Findings:  None.
IMPRESSION: No evidence of DVT within either lower extremity.

## 2019-02-17 DIAGNOSIS — G4700 Insomnia, unspecified: Secondary | ICD-10-CM | POA: Diagnosis not present

## 2019-03-04 DIAGNOSIS — G4700 Insomnia, unspecified: Secondary | ICD-10-CM | POA: Diagnosis not present

## 2019-03-25 DIAGNOSIS — G4700 Insomnia, unspecified: Secondary | ICD-10-CM | POA: Diagnosis not present

## 2019-08-29 ENCOUNTER — Other Ambulatory Visit: Payer: Self-pay

## 2019-08-29 DIAGNOSIS — Z20822 Contact with and (suspected) exposure to covid-19: Secondary | ICD-10-CM

## 2019-08-31 LAB — NOVEL CORONAVIRUS, NAA: SARS-CoV-2, NAA: NOT DETECTED

## 2020-02-19 ENCOUNTER — Ambulatory Visit: Payer: PRIVATE HEALTH INSURANCE

## 2020-02-20 ENCOUNTER — Ambulatory Visit: Payer: PRIVATE HEALTH INSURANCE | Attending: Internal Medicine

## 2020-02-23 ENCOUNTER — Ambulatory Visit: Payer: PRIVATE HEALTH INSURANCE | Attending: Internal Medicine

## 2020-02-23 ENCOUNTER — Other Ambulatory Visit: Payer: Self-pay

## 2020-02-23 DIAGNOSIS — Z23 Encounter for immunization: Secondary | ICD-10-CM

## 2020-02-23 NOTE — Progress Notes (Signed)
   Covid-19 Vaccination Clinic  Name:  [name]    MRN: [id number] DOB: 1986/06/16  02/23/2020  [name] was observed post Covid-19 immunization for 15 minutes without incident. She was provided with Vaccine Information Sheet and instruction to access the V-Safe system.   [name] was instructed to call 911 with any severe reactions post vaccine: Marland Kitchen Difficulty breathing  . Swelling of face and throat  . A fast heartbeat  . A bad rash all over body  . Dizziness and weakness   Immunizations Administered    Name Date Dose VIS Date Route   Pfizer COVID-19 Vaccine 02/23/2020  9:37 AM 0.3 mL 10/24/2019 Intramuscular   Manufacturer: [place]   Lot: [id number]   [place]: [id number]

## 2020-03-17 ENCOUNTER — Ambulatory Visit: Payer: PRIVATE HEALTH INSURANCE | Attending: Internal Medicine

## 2020-03-17 DIAGNOSIS — Z23 Encounter for immunization: Secondary | ICD-10-CM

## 2020-03-17 NOTE — Progress Notes (Signed)
   Covid-19 Vaccination Clinic  Name:  Laura Morton    MRN: [id number] DOB: [birth year]  03/17/2020  [name] was observed post Covid-19 immunization for 15 minutes without incident. She was provided with Vaccine Information Sheet and instruction to access the V-Safe system.   [name] was instructed to call 911 with any severe reactions post vaccine: Marland Kitchen Difficulty breathing  . Swelling of face and throat  . A fast heartbeat  . A bad rash all over body  . Dizziness and weakness   Immunizations Administered    Name Date Dose VIS Date Route   Pfizer COVID-19 Vaccine 03/17/2020 10:44 AM 0.3 mL 01/07/2019 Intramuscular   Manufacturer: [place]   Lot: [id number]   [place]: [id number]

## 2020-06-09 ENCOUNTER — Encounter: Payer: Self-pay | Admitting: Family Medicine

## 2020-06-09 ENCOUNTER — Other Ambulatory Visit (HOSPITAL_COMMUNITY)
Admission: RE | Admit: 2020-06-09 | Discharge: 2020-06-09 | Disposition: A | Discharge status: Home / Self Care | Payer: BC Managed Care – PPO | Source: Ambulatory Visit | Attending: Family Medicine | Admitting: Family Medicine

## 2020-06-09 ENCOUNTER — Ambulatory Visit (INDEPENDENT_AMBULATORY_CARE_PROVIDER_SITE_OTHER): Payer: PRIVATE HEALTH INSURANCE | Admitting: Family Medicine

## 2020-06-09 ENCOUNTER — Other Ambulatory Visit: Payer: Self-pay

## 2020-06-09 VITALS — BP 124/87 | HR 80 | Temp 98.7°F | Wt 134.0 lb

## 2020-06-09 DIAGNOSIS — N939 Abnormal uterine and vaginal bleeding, unspecified: Secondary | ICD-10-CM

## 2020-06-09 DIAGNOSIS — Z124 Encounter for screening for malignant neoplasm of cervix: Secondary | ICD-10-CM | POA: Insufficient documentation

## 2020-06-09 DIAGNOSIS — Z113 Encounter for screening for infections with a predominantly sexual mode of transmission: Secondary | ICD-10-CM

## 2020-06-09 DIAGNOSIS — N898 Other specified noninflammatory disorders of vagina: Secondary | ICD-10-CM

## 2020-06-09 LAB — WET PREP FOR TRICH, YEAST, CLUE
Clue Cell Exam: POSITIVE — AB
Trichomonas Exam: NEGATIVE
Yeast Exam: NEGATIVE

## 2020-06-09 NOTE — Progress Notes (Signed)
BP (!) 124/87   Pulse 80   Temp 98.7 F (37.1 C) (Oral)   Wt 134 lb (60.8 kg)   SpO2 98%   BMI 24.51 kg/m    Subjective:    Patient ID: Laura Morton, female    DOB: May 05, 1986, 34 y.o.   MRN: [id number]  HPI: Smera Guyette is a 34 y.o. female  Chief Complaint  Patient presents with  . Vaginal Bleeding    after sexual encounter and lasted for 3 days   3 days of heavy vaginal bleeding after intercourse nearly a week ago. States she had not been intimate for quite some time prior to this encounter and that the act was painful and that she was very dry during it. No significant vaginal pain, abdominal pain, weakness, lightheadedness. Has a paraguard IUD in place that she's had for over 5 years.   Relevant past medical, surgical, family and social history reviewed and updated as indicated. Interim medical history since our last visit reviewed. Allergies and medications reviewed and updated.  Review of Systems  Per HPI unless specifically indicated above     Objective:    BP (!) 124/87   Pulse 80   Temp 98.7 F (37.1 C) (Oral)   Wt 134 lb (60.8 kg)   SpO2 98%   BMI 24.51 kg/m   Wt Readings from Last 3 Encounters:  06/09/20 134 lb (60.8 kg)  12/23/18 121 lb (54.9 kg)  12/10/18 122 lb 8 oz (55.6 kg)    Physical Exam Vitals and nursing note reviewed.  Constitutional:      Appearance: Normal appearance. She is not ill-appearing.  HENT:     Head: Atraumatic.  Eyes:     Extraocular Movements: Extraocular movements intact.     Conjunctiva/sclera: Conjunctivae normal.  Cardiovascular:     Rate and Rhythm: Normal rate and regular rhythm.     Heart sounds: Normal heart sounds.  Pulmonary:     Effort: Pulmonary effort is normal.     Breath sounds: Normal breath sounds.  Abdominal:     General: Bowel sounds are normal. There is no distension.     Palpations: Abdomen is soft.     Tenderness: There is no abdominal tenderness. There is no right CVA tenderness, left CVA  tenderness or guarding.  Genitourinary:    General: Normal vulva.     Vagina: Vaginal discharge (yellow/cream discharge, copious present in vaginal vault) present.  Musculoskeletal:        General: Normal range of motion.     Cervical back: Normal range of motion and neck supple.  Skin:    General: Skin is warm and dry.  Neurological:     Mental Status: She is alert and oriented to person, place, and time.  Psychiatric:        Mood and Affect: Mood normal.        Thought Content: Thought content normal.        Judgment: Judgment normal.     Results for orders placed or performed in visit on 08/29/19  Novel Coronavirus, NAA ([facility])   Specimen: Nasopharyngeal(NP) swabs in vial transport medium   NASOPHARYNGE  TESTING  Result Value Ref Range   SARS-CoV-2, NAA Not Detected Not Detected      Assessment & Plan:   Problem List Items Addressed This Visit    None    Visit Diagnoses    Vaginal bleeding    -  Primary   Relevant Orders   CBC with Differential/Platelet  Routine screening for STI (sexually transmitted infection)       Relevant Orders   HIV Antibody (routine testing w rflx)   RPR   HSV(herpes simplex vrs) 1+2 ab-IgG   GC/Chlamydia Probe Amp   Screening for cervical cancer       Relevant Orders   Cytology - PAP   Vaginal discharge       Relevant Orders   WET PREP FOR [place], YEAST, CLUE      Vitals, exam all benign at this time, no active vaginal pain or bleeding. Suspect irritation from intercourse after years of no penetration, but obtain STI panel, pap smear, wet prep to r/o other causes. CBC today given several days of heavy bleeding.   25 minutes spent today in direct patient care and counseling  Follow up plan: Return if symptoms worsen or fail to improve.

## 2020-06-10 LAB — CBC WITH DIFFERENTIAL/PLATELET
Basophils Absolute: 0 10*3/uL (ref 0.0–0.2)
Basos: 1 %
EOS (ABSOLUTE): 0.1 10*3/uL (ref 0.0–0.4)
Eos: 2 %
Hematocrit: 43.8 % (ref 34.0–46.6)
Hemoglobin: 13.7 g/dL (ref 11.1–15.9)
Immature Grans (Abs): 0 10*3/uL (ref 0.0–0.1)
Immature Granulocytes: 1 %
Lymphocytes Absolute: 1.5 10*3/uL (ref 0.7–3.1)
Lymphs: 24 %
MCH: 27.2 pg (ref 26.6–33.0)
MCHC: 31.3 g/dL — ABNORMAL LOW (ref 31.5–35.7)
MCV: 87 fL (ref 79–97)
Monocytes Absolute: 0.5 10*3/uL (ref 0.1–0.9)
Monocytes: 7 %
Neutrophils Absolute: 4.3 10*3/uL (ref 1.4–7.0)
Neutrophils: 65 %
Platelets: 337 10*3/uL (ref 150–450)
RBC: 5.03 x10E6/uL (ref 3.77–5.28)
RDW: 13.3 % (ref 11.7–15.4)
WBC: 6.4 10*3/uL (ref 3.4–10.8)

## 2020-06-10 LAB — HSV(HERPES SIMPLEX VRS) I + II AB-IGG
HSV 1 Glycoprotein G Ab, IgG: 43.7 index — ABNORMAL HIGH (ref 0.00–0.90)
HSV 2 IgG, Type Spec: <0.91 index (ref 0.00–0.90)

## 2020-06-10 LAB — RPR: RPR Ser Ql: NONREACTIVE

## 2020-06-10 LAB — HIV ANTIBODY (ROUTINE TESTING W REFLEX): HIV Screen 4th Generation wRfx: NONREACTIVE

## 2020-06-11 LAB — CYTOLOGY - PAP
Adequacy: ABSENT
Comment: NEGATIVE
Diagnosis: NEGATIVE
High risk HPV: NEGATIVE

## 2020-06-11 LAB — GC/CHLAMYDIA PROBE AMP
Chlamydia trachomatis, NAA: NEGATIVE
Neisseria Gonorrhoeae by PCR: NEGATIVE

## 2020-06-16 ENCOUNTER — Other Ambulatory Visit: Payer: Self-pay | Admitting: Family Medicine

## 2020-06-16 MED ORDER — METRONIDAZOLE 500 MG PO TABS: 500.0000 mg | ORAL_TABLET | Freq: Two times a day (BID) | ORAL | 0 refills | Status: DC

## 2020-07-06 ENCOUNTER — Ambulatory Visit
Admission: EM | Admit: 2020-07-06 | Discharge: 2020-07-06 | Disposition: A | Discharge status: Home / Self Care | Payer: BC Managed Care – PPO | Attending: Family Medicine | Admitting: Family Medicine

## 2020-07-06 ENCOUNTER — Encounter: Payer: Self-pay | Admitting: Emergency Medicine

## 2020-07-06 ENCOUNTER — Other Ambulatory Visit: Payer: Self-pay

## 2020-07-06 DIAGNOSIS — N760 Acute vaginitis: Secondary | ICD-10-CM | POA: Diagnosis present

## 2020-07-06 DIAGNOSIS — B9689 Other specified bacterial agents as the cause of diseases classified elsewhere: Secondary | ICD-10-CM | POA: Diagnosis present

## 2020-07-06 DIAGNOSIS — B373 Candidiasis of vulva and vagina: Secondary | ICD-10-CM | POA: Insufficient documentation

## 2020-07-06 DIAGNOSIS — B3731 Acute candidiasis of vulva and vagina: Secondary | ICD-10-CM

## 2020-07-06 LAB — WET PREP, GENITAL
Sperm: NONE SEEN
Trich, Wet Prep: NONE SEEN

## 2020-07-06 MED ORDER — CLOTRIMAZOLE 1 % EX CREA: TOPICAL_CREAM | CUTANEOUS | 0 refills | Status: DC

## 2020-07-06 MED ORDER — METRONIDAZOLE 500 MG PO TABS
500.0000 mg | ORAL_TABLET | Freq: Two times a day (BID) | ORAL | 0 refills | Status: DC
Start: 2020-07-06 — End: 2020-08-11

## 2020-07-06 MED ORDER — FLUCONAZOLE 150 MG PO TABS: 150.0000 mg | ORAL_TABLET | Freq: Once | ORAL | 0 refills | Status: AC

## 2020-07-06 NOTE — ED Triage Notes (Signed)
Patient states she was treated for BV 3 weeks ago. She c/o vaginal itching and discharge that started yesterday.

## 2020-07-06 NOTE — ED Provider Notes (Signed)
[facility]    CSN: [id number] Arrival date & time: 07/06/20  1836  History   Chief Complaint Chief Complaint  Patient presents with  . Vaginal Itching  . Vaginal Discharge   HPI 34 year old female presents with the above complaints.  Patient reports recent BV.  Patient reports that she seems to be improving and then had intercourse 2 days ago.  Subsequently developed severe vaginal itching, discharge.  She is having unprotected intercourse.  Denies concerns for STD.  She is experiencing some pain after urination if urine touches the affected area.  She states that she is scratching significantly.  No relieving factors.  No other complaints.  Past Medical History:  Diagnosis Date  . Asthma   . Environmental allergies   . Knee fracture, left   . Vertigo    Patient Active Problem List   Diagnosis Date Noted  . Anxiety 12/11/2018  . 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 Allergic rhinitis 12/13/2015  . Asthma 12/13/2015    Home Medications    Prior to Admission medications   Medication Sig Start Date End Date Taking? Authorizing Provider  Albuterol Sulfate (PROAIR HFA IN) Inhale into the lungs as needed.   Yes [provider]  busPIRone (BUSPAR) 7.5 MG tablet Take 1 tablet (7.5 mg total) by mouth 2 (two) times daily. 12/10/18  Yes Volney American, PA-C  hydrOXYzine (ATARAX/VISTARIL) 25 MG tablet Take 1 tablet (25 mg total) by mouth 3 (three) times daily as needed. 12/10/18  Yes Lane, [place] IUD IUD 1 each by Intrauterine route once.   Yes [provider]  clotrimazole (LOTRIMIN) 1 % cream Apply to affected area 2 times daily 07/06/20   Thersa Salt G, DO  fluconazole (DIFLUCAN) 150 MG tablet Take 1 tablet (150 mg total) by mouth once for 1 dose. Repeat dose in 72 hours. 07/06/20 07/06/20  Coral Spikes, DO  metroNIDAZOLE (FLAGYL) 500 MG tablet Take 1 tablet (500 mg total) by mouth 2 (two) times daily. 07/06/20    Coral Spikes, DO  beclomethasone (QVAR) 40 MCG/ACT inhaler Inhale into the lungs 2 (two) times daily.  07/06/20  [provider]    Family History Family History  Problem Relation Age of Onset  . Diabetes Mother   . Lupus Mother   . Hypertension Mother   . Hypertension Father   . Heart disease Sister   . Spina bifida Sister     Social History Social History   Tobacco Use  . Smoking status: Never Smoker  . Smokeless tobacco: Never Used  Substance Use Topics  . Alcohol use: No  . Drug use: No     Allergies   Bactrim [sulfamethoxazole-trimethoprim] and Lamictal [lamotrigine]   Review of Systems Review of Systems  Constitutional: Negative.   Genitourinary: Positive for vaginal discharge.       Vaginal itching.   Physical Exam Triage Vital Signs ED Triage Vitals  Enc Vitals Group     BP 07/06/20 1925 133/90     Pulse Rate 07/06/20 1925 92     Resp 07/06/20 1925 18     Temp 07/06/20 1925 98.4 F (36.9 C)     Temp Source 07/06/20 1925 Oral     SpO2 07/06/20 1925 100 %     Weight 07/06/20 1924 126 lb (57.2 kg)     Height 07/06/20 1924 5\' 2"  (1.575 m)     Head Circumference --      Peak  Flow --      Pain Score 07/06/20 1924 2     Pain Loc --      Pain Edu? --      Excl. in [place]? --    Updated Vital Signs BP 133/90 (BP Location: Right Arm)   Pulse 92   Temp 98.4 F (36.9 C) (Oral)   Resp 18   Ht 5\' 2"  (1.575 m)   Wt 57.2 kg   SpO2 100%   BMI 23.05 kg/m   Visual Acuity Right Eye Distance:   Left Eye Distance:   Bilateral Distance:    Right Eye Near:   Left Eye Near:    Bilateral Near:     Physical Exam Constitutional:      General: She is not in acute distress.    Appearance: Normal appearance. She is not ill-appearing.  HENT:     Head: Normocephalic and atraumatic.  Eyes:     General:        Right eye: No discharge.        Left eye: No discharge.     Conjunctiva/sclera: Conjunctivae normal.  Cardiovascular:     Rate and Rhythm:  Normal rate and regular rhythm.  Pulmonary:     Effort: Pulmonary effort is normal.     Breath sounds: Normal breath sounds. No wheezing, rhonchi or rales.  Abdominal:     General: There is no distension.     Palpations: Abdomen is soft.     Tenderness: There is no abdominal tenderness.  Neurological:     Mental Status: She is alert.  Psychiatric:        Mood and Affect: Mood normal.        Behavior: Behavior normal.    UC Treatments / Results  Labs (all labs ordered are listed, but only abnormal results are displayed) Labs Reviewed  WET PREP, GENITAL - Abnormal; Notable for the following components:      Result Value   Yeast Wet Prep HPF POC PRESENT (*)    Clue Cells Wet Prep HPF POC PRESENT (*)    WBC, Wet Prep HPF POC MODERATE (*)    All other components within normal limits    EKG   Radiology No results found.  Procedures Procedures (including critical care time)  Medications Ordered in UC Medications - No data to display  Initial Impression / Assessment and Plan / UC Course  I have reviewed the triage vital signs and the nursing notes.  Pertinent labs & imaging results that were available during my care of the patient were reviewed by me and considered in my medical decision making (see chart for details).    34 year old female presents with BV and yeast vaginitis.  Treating with Diflucan, Flagyl.  Patient requested some topical medication as well to help with itching while the oral medications take effect.  Rx sent for clotrimazole.  Final Clinical Impressions(s) / UC Diagnoses   Final diagnoses:  BV (bacterial vaginosis)  Yeast vaginitis   Discharge Instructions   None    ED Prescriptions    Medication Sig Dispense Auth. Provider   fluconazole (DIFLUCAN) 150 MG tablet Take 1 tablet (150 mg total) by mouth once for 1 dose. Repeat dose in 72 hours. 2 tablet Jesua Tamblyn G, DO   metroNIDAZOLE (FLAGYL) 500 MG tablet Take 1 tablet (500 mg total) by mouth 2  (two) times daily. 14 tablet Dawnmarie Breon G, DO   clotrimazole (LOTRIMIN) 1 % cream Apply to affected area 2  times daily 28 g Coral Spikes, DO     PDMP not reviewed this encounter.   Coral Spikes, [place] 07/06/20 2012

## 2020-07-07 ENCOUNTER — Ambulatory Visit: Payer: PRIVATE HEALTH INSURANCE | Admitting: Family Medicine

## 2020-07-08 ENCOUNTER — Ambulatory Visit: Payer: PRIVATE HEALTH INSURANCE | Admitting: Family Medicine

## 2020-08-10 NOTE — Telephone Encounter (Signed)
Needs appt, we want to be sure we know what we are treating.

## 2020-08-11 ENCOUNTER — Ambulatory Visit (INDEPENDENT_AMBULATORY_CARE_PROVIDER_SITE_OTHER): Payer: PRIVATE HEALTH INSURANCE | Admitting: Unknown Physician Specialty

## 2020-08-11 ENCOUNTER — Other Ambulatory Visit: Payer: Self-pay

## 2020-08-11 ENCOUNTER — Encounter: Payer: Self-pay | Admitting: Unknown Physician Specialty

## 2020-08-11 VITALS — BP 117/81 | HR 84 | Temp 98.4°F | Wt 132.0 lb

## 2020-08-11 DIAGNOSIS — N760 Acute vaginitis: Secondary | ICD-10-CM | POA: Diagnosis not present

## 2020-08-11 DIAGNOSIS — B9689 Other specified bacterial agents as the cause of diseases classified elsewhere: Secondary | ICD-10-CM

## 2020-08-11 DIAGNOSIS — R300 Dysuria: Secondary | ICD-10-CM

## 2020-08-11 DIAGNOSIS — Z23 Encounter for immunization: Secondary | ICD-10-CM

## 2020-08-11 LAB — WET PREP FOR TRICH, YEAST, CLUE
Clue Cell Exam: POSITIVE — AB
Trichomonas Exam: NEGATIVE
Yeast Exam: POSITIVE — AB

## 2020-08-11 MED ORDER — CLOTRIMAZOLE 1 % EX CREA
TOPICAL_CREAM | CUTANEOUS | 0 refills | Status: DC
Start: 2020-08-11 — End: 2021-02-08

## 2020-08-11 MED ORDER — FLUCONAZOLE 150 MG PO TABS: ORAL_TABLET | ORAL | 0 refills | Status: DC

## 2020-08-11 MED ORDER — METRONIDAZOLE 500 MG PO TABS: 500.0000 mg | ORAL_TABLET | Freq: Two times a day (BID) | ORAL | 0 refills | Status: DC

## 2020-08-11 NOTE — Addendum Note (Signed)
Addended by: Lesle Chris on: 08/11/2020 02:13 PM   Modules accepted: Orders

## 2020-08-11 NOTE — Progress Notes (Signed)
BP 117/81   Pulse 84   Temp 98.4 F (36.9 C) (Oral)   Wt 132 lb (59.9 kg)   SpO2 96%   BMI 24.14 kg/m    Subjective:    Patient ID: Laura Morton, female    DOB: 1986/08/11, 34 y.o.   MRN: [id number]  HPI: Laura Morton is a 34 y.o. female  Chief Complaint  Patient presents with  . Vaginal Itching    pt states was seen by UC, given antibiotics and vaginal cream, not helping  . Dysuria   Reviewed UC note from 8/24.  Was seen for vaginal itching sx with BV and yeast.  Given Clotrimazole, Metronidazole and Fluconazole.  Both yeast and clue cells seen on wet prep.  Using Paragard IUD.  Sexually active.  No unusual vaginal discharge.  This problem is recurrent with monthly symptoms.     Relevant past medical, surgical, family and social history reviewed and updated as indicated. Interim medical history since our last visit reviewed. Allergies and medications reviewed and updated.  Review of Systems  Per HPI unless specifically indicated above     Objective:    BP 117/81   Pulse 84   Temp 98.4 F (36.9 C) (Oral)   Wt 132 lb (59.9 kg)   SpO2 96%   BMI 24.14 kg/m   Wt Readings from Last 3 Encounters:  08/11/20 132 lb (59.9 kg)  07/06/20 126 lb (57.2 kg)  06/09/20 134 lb (60.8 kg)    Physical Exam Constitutional:      General: She is not in acute distress.    Appearance: Normal appearance. She is well-developed.  HENT:     Head: Normocephalic and atraumatic.  Eyes:     General: Lids are normal. No scleral icterus.       Right eye: No discharge.        Left eye: No discharge.     Conjunctiva/sclera: Conjunctivae normal.  Cardiovascular:     Rate and Rhythm: Normal rate.  Pulmonary:     Effort: Pulmonary effort is normal.  Abdominal:     Palpations: There is no hepatomegaly or splenomegaly.  Musculoskeletal:        General: Normal range of motion.  Skin:    Coloration: Skin is not pale.     Findings: No rash.  Neurological:     Mental Status: She is alert  and oriented to person, place, and time.  Psychiatric:        Behavior: Behavior normal.        Thought Content: Thought content normal.        Judgment: Judgment normal.    Wet prep: positive for clue cells and yeast  Urine 2 plus leuks  Results for orders placed or performed during the hospital encounter of 07/06/20  Wet prep, genital  Result Value Ref Range   Yeast Wet Prep HPF POC PRESENT (A) NONE SEEN   Trich, Wet Prep NONE SEEN NONE SEEN   Clue Cells Wet Prep HPF POC PRESENT (A) NONE SEEN   WBC, Wet Prep HPF POC MODERATE (A) NONE SEEN   Sperm NONE SEEN       Assessment & Plan:   Problem List Items Addressed This Visit    None    Visit Diagnoses    Dysuria    -  Primary   2 plus leuks.  Will send for C&S   Relevant Orders   UA/M w/rflx Culture, Routine   WET PREP FOR [place], YEAST, CLUE  Acute vaginitis       Recurrent.  Will rx Diflucan daily for 3 days and then monthly.     Bacterial vaginitis       Rx for Metronidazole 500 mg BID for 7 days   Relevant Medications   clotrimazole (LOTRIMIN) 1 % cream   fluconazole (DIFLUCAN) 150 MG tablet   metroNIDAZOLE (FLAGYL) 500 MG tablet       Follow up plan: Return if symptoms worsen or fail to improve.

## 2020-08-12 ENCOUNTER — Ambulatory Visit: Payer: PRIVATE HEALTH INSURANCE | Admitting: Unknown Physician Specialty

## 2020-08-13 LAB — MICROSCOPIC EXAMINATION: RBC, Urine: NONE SEEN /hpf (ref 0–2)

## 2020-08-13 LAB — UA/M W/RFLX CULTURE, ROUTINE
Bilirubin, UA: NEGATIVE
Glucose, UA: NEGATIVE
Nitrite, UA: NEGATIVE
RBC, UA: NEGATIVE
Specific Gravity, UA: 1.025 (ref 1.005–1.030)
Urobilinogen, Ur: 0.2 mg/dL (ref 0.2–1.0)
pH, UA: 5.5 (ref 5.0–7.5)

## 2020-08-13 LAB — URINE CULTURE, REFLEX: Organism ID, Bacteria: NO GROWTH

## 2020-09-17 ENCOUNTER — Encounter: Payer: Self-pay | Admitting: Nurse Practitioner

## 2020-09-17 ENCOUNTER — Ambulatory Visit
Admission: RE | Admit: 2020-09-17 | Discharge: 2020-09-17 | Disposition: A | Discharge status: Home / Self Care | Payer: BC Managed Care – PPO | Source: Ambulatory Visit | Attending: Nurse Practitioner | Admitting: Nurse Practitioner

## 2020-09-17 ENCOUNTER — Ambulatory Visit (INDEPENDENT_AMBULATORY_CARE_PROVIDER_SITE_OTHER): Payer: PRIVATE HEALTH INSURANCE | Admitting: Nurse Practitioner

## 2020-09-17 ENCOUNTER — Other Ambulatory Visit: Payer: Self-pay

## 2020-09-17 ENCOUNTER — Telehealth: Payer: Self-pay | Admitting: Nurse Practitioner

## 2020-09-17 VITALS — BP 108/74 | HR 94 | Temp 98.1°F | Wt 131.2 lb

## 2020-09-17 DIAGNOSIS — Z3201 Encounter for pregnancy test, result positive: Secondary | ICD-10-CM

## 2020-09-17 LAB — PREGNANCY, URINE: Preg Test, Ur: POSITIVE — AB

## 2020-09-17 NOTE — Progress Notes (Signed)
BP 108/74   Pulse 94   Temp 98.1 F (36.7 C) (Oral)   Wt 131 lb 3.2 oz (59.5 kg)   SpO2 99%   BMI 24.00 kg/m    Subjective:    Patient ID: Laura Morton, female    DOB: 1985/11/26, 34 y.o.   MRN: [id number]  HPI: Laura Morton is a 34 y.o. female presenting with significant other, Laura Morton, for a positive pregnancy test.  Chief Complaint  Patient presents with  . Routine Prenatal Visit    pt would like a confirmed pregnancy test  . Contraception    pt states she has had IUD in for 6 years and was late on her period so she took a pregnancy test and it was positive    POSITIVE AT-HOME PREGNANCY TEST Reports two at-home positive pregnancy tests yesterday.  Has a Copper IUD in place that she reports was placed at [place] 6 years ago.   Gravida/Para: G3P0; both previous spontaneous abortions LMP: 08/14/20 Home pregnancy test: Positive  Nausea: no Vomiting: no Breast tenderness: yes Abdominal pain: yes; cramping Vaginal bleeding: no Pregnancy or labor complications: no Fetal abnormalities: no Contraception: Has Paraguard IUD; has been in place for 6 years; placed originially by [facility].  Allergies  Allergen Reactions  . Bactrim [Sulfamethoxazole-Trimethoprim]     Per patient request, never get prescribe this medication  . Lamictal [Lamotrigine]     Per patient request, never get prescribe this medication   Outpatient Encounter Medications as of 09/17/2020  Medication Sig Note  . Albuterol Sulfate (PROAIR HFA IN) Inhale into the lungs as needed.   . busPIRone (BUSPAR) 10 MG tablet Take 20 mg by mouth 3 (three) times daily.   . fluconazole (DIFLUCAN) 150 MG tablet Take 1 daily for 3 days and then monthly.   . hydrOXYzine (ATARAX/VISTARIL) 25 MG tablet Take 1 tablet (25 mg total) by mouth 3 (three) times daily as needed.   Mar[place] Kitchen PARAGARD INTRAUTERINE COPPER IUD IUD 1 each by Intrauterine route once.   . clotrimazole (LOTRIMIN) 1 % cream Apply to affected area 2 times  daily (Patient not taking: Reported on 09/17/2020)   . metroNIDAZOLE (FLAGYL) 500 MG tablet Take 1 tablet (500 mg total) by mouth 2 (two) times daily. (Patient not taking: Reported on 09/17/2020)   . [DISCONTINUED] beclomethasone (QVAR) 40 MCG/ACT inhaler Inhale into the lungs 2 (two) times daily. 12/15/2015: Received from: [place]: Inhale into the lungs.   No facility-administered encounter medications on file as of 09/17/2020.   Patient Active Problem List   Diagnosis Date Noted  . Positive urine pregnancy test 09/17/2020  . Anxiety 12/11/2018  . 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 Allergic rhinitis 12/13/2015  . Asthma 12/13/2015   Past Medical History:  Diagnosis Date  . Asthma   . Environmental allergies   . Knee fracture, left   . Vertigo    Relevant past medical, surgical, family and social history reviewed and updated as indicated. Interim medical history since our last visit reviewed. Allergies and medications reviewed and updated.  Review of Systems  Constitutional: Negative.  Negative for fever.  Respiratory: Negative.   Gastrointestinal: Positive for abdominal pain (some mild cramping). Negative for abdominal distention, nausea and vomiting.  Skin: Negative.   Neurological: Negative.   Psychiatric/Behavioral: Negative.     Per HPI unless specifically indicated above     Objective:    BP 108/74   Pulse 94   Temp 98.1 F (36.7  C) (Oral)   Wt 131 lb 3.2 oz (59.5 kg)   SpO2 99%   BMI 24.00 kg/m   Wt Readings from Last 3 Encounters:  09/17/20 131 lb 3.2 oz (59.5 kg)  08/11/20 132 lb (59.9 kg)  07/06/20 126 lb (57.2 kg)    Physical Exam Vitals and nursing note reviewed.  Constitutional:      General: She is not in acute distress.    Appearance: Normal appearance. She is not toxic-appearing.  HENT:     Head: Normocephalic and atraumatic.  Abdominal:     General: Abdomen is flat. There is no distension.  Skin:    General: Skin is  warm and dry.     Coloration: Skin is not jaundiced or pale.     Findings: No erythema.  Neurological:     General: No focal deficit present.     Mental Status: She is alert and oriented to person, place, and time.     Motor: No weakness.     Gait: Gait normal.  Psychiatric:        Mood and Affect: Mood normal.        Behavior: Behavior normal.        Thought Content: Thought content normal.        Judgment: Judgment normal.       Assessment & Plan:   Problem List Items Addressed This Visit      Other   Positive urine pregnancy test - Primary    With copper IUD in place, will place urgent referral to OB/GYN for ongoing treatment.  Will obtain urgent transvaginal ultrasound today to rule out ectopic pregnancy.  Ongoing evaluation and management to be provided by OB/GYN.  With any sudden onset of abdominal pain/cramping, severe bleeding, nausea/vomiting, patient to go to ER.      Relevant Orders   Pregnancy, urine (Completed)   Ambulatory referral to Obstetrics / Gynecology   US OB LESS THAN 14 WEEKS WITH OB TRANSVAGINAL       Follow up plan: Return if symptoms worsen or fail to improve.

## 2020-09-17 NOTE — Assessment & Plan Note (Signed)
With copper IUD in place, will place urgent referral to OB/GYN for ongoing treatment.  Will obtain urgent transvaginal ultrasound today to rule out ectopic pregnancy.  Ongoing evaluation and management to be provided by OB/GYN.  With any sudden onset of abdominal pain/cramping, severe bleeding, nausea/vomiting, patient to go to ER.

## 2020-09-17 NOTE — Telephone Encounter (Signed)
Called patient and discussed results.  IUD malpositioned in cervix and intrauterine gestational sac and yolk sac present.  To keep follow up appointment with OB/GYN next week.  With any significant abdominal pain or bleeding over weekend or early next week, go to ER.  All questions answered.

## 2020-09-23 ENCOUNTER — Encounter: Payer: Self-pay | Admitting: Obstetrics and Gynecology

## 2020-09-23 ENCOUNTER — Other Ambulatory Visit: Payer: Self-pay

## 2020-09-23 ENCOUNTER — Ambulatory Visit (INDEPENDENT_AMBULATORY_CARE_PROVIDER_SITE_OTHER): Payer: PRIVATE HEALTH INSURANCE | Admitting: Obstetrics and Gynecology

## 2020-09-23 VITALS — BP 104/58 | Wt 134.0 lb

## 2020-09-23 DIAGNOSIS — Z30432 Encounter for removal of intrauterine contraceptive device: Secondary | ICD-10-CM

## 2020-09-23 DIAGNOSIS — T8331XA Breakdown (mechanical) of intrauterine contraceptive device, initial encounter: Secondary | ICD-10-CM

## 2020-09-23 NOTE — Progress Notes (Signed)
NOB-pt would like to terminate pregnancy

## 2020-09-23 NOTE — Progress Notes (Signed)
Obstetrics & Gynecology Office Visit   Chief Complaint:  Chief Complaint  Patient presents with  . Initial Prenatal Visit    first pregnancy    History of Present Illness: 34 y.o. G1P0 presenting with positive UPT in the setting of ParaGard IUD use, TVUS 09/17/2020 confirming [redacted]w[redacted]d intrauterine pregnancy by GS measuerment of [redacted]w[redacted]d no embryo or yolk sac visualized, normal adnexal structures.   This is an undesired pregnancy and the patient wishes to discuss termination options.      Review of Systems:  Review of Systems  Constitutional: Negative.   Gastrointestinal: Negative for abdominal pain.  Genitourinary: Negative.   Neurological: Negative for headaches.     Past Medical History:  Past Medical History:  Diagnosis Date  . Asthma   . Environmental allergies   . Knee fracture, left   . Vertigo     Past Surgical History:  History reviewed. No pertinent surgical history.  Gynecologic History: Patient's last menstrual period was 08/14/2020.  Obstetric History: G1P0  Family History:  Family History  Problem Relation Age of Onset  . Diabetes Mother   . Lupus Mother   . Hypertension Mother   . Hypertension Father   . Heart disease Sister   . Spina bifida Sister     Social History:  Social History   Socioeconomic History  . Marital status: Single    Spouse name: Not on file  . Number of children: Not on file  . Years of education: Not on file  . Highest education level: Not on file  Occupational History  . Not on file  Tobacco Use  . Smoking status: Never Smoker  . Smokeless tobacco: Never Used  Vaping Use  . Vaping Use: Never used  Substance and Sexual Activity  . Alcohol use: No  . Drug use: No  . Sexual activity: Yes    Partners: Male    Birth control/protection: None  Other Topics Concern  . Not on file  Social History Narrative  . Not on file   Social Determinants of Health   Financial Resource Strain:   . Difficulty of Paying Living  Expenses: Not on file  Food Insecurity:   . Worried About Charity fundraiser in the Last Year: Not on file  . Ran Out of Food in the Last Year: Not on file  Transportation Needs:   . Lack of Transportation (Medical): Not on file  . Lack of Transportation (Non-Medical): Not on file  Physical Activity:   . Days of Exercise per Week: Not on file  . Minutes of Exercise per Session: Not on file  Stress:   . Feeling of Stress : Not on file  Social Connections:   . Frequency of Communication with Friends and Family: Not on file  . Frequency of Social Gatherings with Friends and Family: Not on file  . Attends Religious Services: Not on file  . Active Member of Clubs or Organizations: Not on file  . Attends Archivist Meetings: Not on file  . Marital Status: Not on file  Intimate Partner Violence:   . Fear of Current or Ex-Partner: Not on file  . Emotionally Abused: Not on file  . Physically Abused: Not on file  . Sexually Abused: Not on file    Allergies:  Allergies  Allergen Reactions  . Bactrim [Sulfamethoxazole-Trimethoprim]     Per patient request, never get prescribe this medication  . Lamictal [Lamotrigine]     Per patient request, never  get prescribe this medication    Medications: Prior to Admission medications   Medication Sig Start Date End Date Taking? Authorizing Provider  busPIRone (BUSPAR) 10 MG tablet Take 20 mg by mouth 3 (three) times daily. 07/21/20  Yes [provider]  hydrOXYzine (ATARAX/VISTARIL) 25 MG tablet Take 1 tablet (25 mg total) by mouth 3 (three) times daily as needed. 12/10/18  Yes Lane, [place] IUD IUD 1 each by Intrauterine route once.    Yes [provider]  Albuterol Sulfate (PROAIR HFA IN) Inhale into the lungs as needed. Patient not taking: Reported on 09/23/2020    [provider]  clotrimazole (LOTRIMIN) 1 % cream Apply to affected area 2 times daily Patient not  taking: Reported on 09/17/2020 08/11/20   Kathrine Haddock, NP  fluconazole (DIFLUCAN) 150 MG tablet Take 1 daily for 3 days and then monthly. Patient not taking: Reported on 09/23/2020 08/11/20   Kathrine Haddock, NP  metroNIDAZOLE (FLAGYL) 500 MG tablet Take 1 tablet (500 mg total) by mouth 2 (two) times daily. Patient not taking: Reported on 09/17/2020 08/11/20   Kathrine Haddock, NP  beclomethasone (QVAR) 40 MCG/ACT inhaler Inhale into the lungs 2 (two) times daily.  07/06/20  [provider]    Physical Exam Vitals:  Vitals:   09/23/20 1310  BP: (!) 104/58   Patient's last menstrual period was 08/14/2020.  General: NAD, well nourished, appears stated age 82: normocephalic, anicteric Pulmonary: No increased work of breathing Genitourinary:  External: Normal external female genitalia.  Normal urethral meatus, normal  Bartholin's and Skene's glands.    Vagina: Normal vaginal mucosa, no evidence of prolapse.    Cervix: Grossly normal in appearance, no bleeding  Uterus: Non-enlarged, mobile, normal contour.  No CMT  Adnexa: ovaries non-enlarged, no adnexal masses  Rectal: deferred  Lymphatic: no evidence of inguinal lymphadenopathy Extremities: no edema, erythema, or tenderness Neurologic: Grossly intact Psychiatric: mood appropriate, affect full  Female chaperone present for pelvic  portions of the physical exam   GYNECOLOGY OFFICE PROCEDURE NOTE  Laura Morton is a 34 y.o. G1P0 here forParGard IUD removal. She desires removal secondary to displacement into lower uterine segment and concurrent IUP.  IUD Removal  Patient identified, informed consent performed, consent signed.  Patient was in the dorsal lithotomy position, normal external genitalia was noted.  A speculum was placed in the patient's vagina, normal discharge was noted, no lesions. The cervix was visualized, no lesions, no abnormal discharge.  The strings of the IUD were grasped and pulled using ring forceps. The IUD  was removed in its entirety.   Patient tolerated the procedure well.    US OB LESS THAN 14 WEEKS WITH OB TRANSVAGINAL  Result Date: 09/17/2020 CLINICAL DATA:  Positive pregnancy test.  IUD in place. EXAM: OBSTETRIC <14 WK [place] AND TRANSVAGINAL OB US TECHNIQUE: Both transabdominal and transvaginal ultrasound examinations were performed for complete evaluation of the gestation as well as the maternal uterus, adnexal regions, and pelvic cul-de-sac. Transvaginal technique was performed to assess early pregnancy. COMPARISON:  None. FINDINGS: Intrauterine gestational sac: Single. Yolk sac:  Visualized. Embryo:  Not Visualized. MSD: 10.1 mm   5 w   5 d Subchorionic hemorrhage:  None visualized. Maternal uterus/adnexae: IUD malpositioned in the cervix. The ovaries are unremarkable. Corpus luteum in the left ovary. IMPRESSION: 1. Early intrauterine gestational sac and yolk sac, but no fetal pole or cardiac activity yet visualized. Recommend follow-up quantitative B-HCG levels and follow-up US in  14 days to assess viability. This recommendation follows SRU consensus guidelines: Diagnostic Criteria for Nonviable Pregnancy Early in the First Trimester. [employer] Med 2013; 081:3887-19. 2. IUD malpositioned in the cervix. Electronically Signed   By: Titus Dubin M.D.   On: 09/17/2020 15:54      Assessment: 34 y.o. G1P0 IUD failure  Plan: Problem List Items Addressed This Visit    None    Visit Diagnoses    Intrauterine device (IUD) contraceptive failure resulting in pregnancy    -  Primary   Encounter for IUD removal         1) IUD Failure - IUD removed successfully - [facility] options leaning towards medical given gestational age - Interested in IUD vs tubal for contraception going forward  2) A total of 20 minutes were spent in face-to-face contact with the patient during this encounter with over half of that time devoted to counseling and  coordination of care.  3) Return if symptoms worsen or fail to improve, call to schedule tubal.    Malachy Mood, MD, [place], [place] 09/23/2020, 1:24 PM

## 2020-09-23 NOTE — Patient Instructions (Signed)
[facility] [address] [place], [place] [address] [phone]

## 2021-02-08 ENCOUNTER — Encounter: Payer: Self-pay | Admitting: Nurse Practitioner

## 2021-02-08 ENCOUNTER — Other Ambulatory Visit: Payer: Self-pay

## 2021-02-08 ENCOUNTER — Ambulatory Visit (INDEPENDENT_AMBULATORY_CARE_PROVIDER_SITE_OTHER): Payer: Managed Care, Other (non HMO) | Admitting: Nurse Practitioner

## 2021-02-08 VITALS — BP 109/69 | HR 89 | Temp 98.4°F | Wt 144.8 lb

## 2021-02-08 DIAGNOSIS — Z23 Encounter for immunization: Secondary | ICD-10-CM | POA: Diagnosis not present

## 2021-02-08 DIAGNOSIS — J45909 Unspecified asthma, uncomplicated: Secondary | ICD-10-CM

## 2021-02-08 DIAGNOSIS — N3000 Acute cystitis without hematuria: Secondary | ICD-10-CM

## 2021-02-08 DIAGNOSIS — R8281 Pyuria: Secondary | ICD-10-CM | POA: Diagnosis not present

## 2021-02-08 DIAGNOSIS — N898 Other specified noninflammatory disorders of vagina: Secondary | ICD-10-CM | POA: Diagnosis not present

## 2021-02-08 LAB — URINALYSIS, ROUTINE W REFLEX MICROSCOPIC
Bilirubin, UA: NEGATIVE
Glucose, UA: NEGATIVE
Ketones, UA: NEGATIVE
Nitrite, UA: NEGATIVE
Specific Gravity, UA: 1.02 (ref 1.005–1.030)
Urobilinogen, Ur: 0.2 mg/dL (ref 0.2–1.0)
pH, UA: 7 (ref 5.0–7.5)

## 2021-02-08 LAB — MICROSCOPIC EXAMINATION

## 2021-02-08 LAB — WET PREP FOR TRICH, YEAST, CLUE
Clue Cell Exam: NEGATIVE
Trichomonas Exam: NEGATIVE
Yeast Exam: NEGATIVE

## 2021-02-08 MED ORDER — QVAR 40 MCG/ACT IN AERS
2.0000 | INHALATION_SPRAY | Freq: Two times a day (BID) | RESPIRATORY_TRACT | 0 refills | Status: DC
Start: 2021-02-08 — End: 2021-11-09

## 2021-02-08 MED ORDER — ALBUTEROL SULFATE HFA 108 (90 BASE) MCG/ACT IN AERS: 2.0000 | INHALATION_SPRAY | Freq: Four times a day (QID) | RESPIRATORY_TRACT | 1 refills | Status: DC | PRN

## 2021-02-08 MED ORDER — ALTAVERA 0.15-30 MG-MCG PO TABS
1.0000 | ORAL_TABLET | Freq: Every day | ORAL | 12 refills | Status: DC
Start: 2021-02-08 — End: 2021-11-09

## 2021-02-08 MED ORDER — AEROCHAMBER PLUS MISC: 2 refills | Status: DC

## 2021-02-08 MED ORDER — NITROFURANTOIN MONOHYD MACRO 100 MG PO CAPS: 100.0000 mg | ORAL_CAPSULE | Freq: Two times a day (BID) | ORAL | 0 refills | Status: DC

## 2021-02-08 NOTE — Assessment & Plan Note (Signed)
Chronic, stable. Uses QVAR and albuterol prn. Refill for albuterol sent to pharmacy. Will follow-up in 1-2 months at yearly physical.

## 2021-02-08 NOTE — Patient Instructions (Signed)
Urinary Tract Infection, Adult  A urinary tract infection (UTI) is an infection of any part of the urinary tract. The urinary tract includes the kidneys, ureters, bladder, and urethra. These organs make, store, and get rid of urine in the body. An upper UTI affects the ureters and kidneys. A lower UTI affects the bladder and urethra. What are the causes? Most urinary tract infections are caused by bacteria in your genital area around your urethra, where urine leaves your body. These bacteria grow and cause inflammation of your urinary tract. What increases the risk? You are more likely to develop this condition if:  You have a urinary catheter that stays in place.  You are not able to control when you urinate or have a bowel movement (incontinence).  You are female and you: ? Use a spermicide or diaphragm for birth control. ? Have low estrogen levels. ? Are pregnant.  You have certain genes that increase your risk.  You are sexually active.  You take antibiotic medicines.  You have a condition that causes your flow of urine to slow down, such as: ? An enlarged prostate, if you are female. ? Blockage in your urethra. ? A kidney stone. ? A nerve condition that affects your bladder control (neurogenic bladder). ? Not getting enough to drink, or not urinating often.  You have certain medical conditions, such as: ? Diabetes. ? A weak disease-fighting system (immunesystem). ? Sickle cell disease. ? Gout. ? Spinal cord injury. What are the signs or symptoms? Symptoms of this condition include:  Needing to urinate right away (urgency).  Frequent urination. This may include small amounts of urine each time you urinate.  Pain or burning with urination.  Blood in the urine.  Urine that smells bad or unusual.  Trouble urinating.  Cloudy urine.  Vaginal discharge, if you are female.  Pain in the abdomen or the lower back. You may also have:  Vomiting or a decreased  appetite.  Confusion.  Irritability or tiredness.  A fever or chills.  Diarrhea. The first symptom in older adults may be confusion. In some cases, they may not have any symptoms until the infection has worsened. How is this diagnosed? This condition is diagnosed based on your medical history and a physical exam. You may also have other tests, including:  Urine tests.  Blood tests.  Tests for STIs (sexually transmitted infections). If you have had more than one UTI, a cystoscopy or imaging studies may be done to determine the cause of the infections. How is this treated? Treatment for this condition includes:  Antibiotic medicine.  Over-the-counter medicines to treat discomfort.  Drinking enough water to stay hydrated. If you have frequent infections or have other conditions such as a kidney stone, you may need to see a health care provider who specializes in the urinary tract (urologist). In rare cases, urinary tract infections can cause sepsis. Sepsis is a life-threatening condition that occurs when the body responds to an infection. Sepsis is treated in the hospital with IV antibiotics, fluids, and other medicines. Follow these instructions at home: Medicines  Take over-the-counter and prescription medicines only as told by your health care provider.  If you were prescribed an antibiotic medicine, take it as told by your health care provider. Do not stop using the antibiotic even if you start to feel better. General instructions  Make sure you: ? Empty your bladder often and completely. Do not hold urine for long periods of time. ? Empty your bladder after   sex. ? Wipe from front to back after urinating or having a bowel movement if you are female. Use each tissue only one time when you wipe.  Drink enough fluid to keep your urine pale yellow.  Keep all follow-up visits. This is important.   Contact a health care provider if:  Your symptoms do not get better after 1-2  days.  Your symptoms go away and then return. Get help right away if:  You have severe pain in your back or your lower abdomen.  You have a fever or chills.  You have nausea or vomiting. Summary  A urinary tract infection (UTI) is an infection of any part of the urinary tract, which includes the kidneys, ureters, bladder, and urethra.  Most urinary tract infections are caused by bacteria in your genital area.  Treatment for this condition often includes antibiotic medicines.  If you were prescribed an antibiotic medicine, take it as told by your health care provider. Do not stop using the antibiotic even if you start to feel better.  Keep all follow-up visits. This is important. This information is not intended to replace advice given to you by your health care provider. Make sure you discuss any questions you have with your health care provider. Document Revised: 06/11/2020 Document Reviewed: 06/11/2020 Elsevier Patient Education  2021 [employer]  

## 2021-02-08 NOTE — Addendum Note (Signed)
Addended by: Vance Peper A on: 02/08/2021 02:56 PM   Modules accepted: Orders

## 2021-02-08 NOTE — Progress Notes (Signed)
Acute Office Visit  Subjective:    Patient ID: Laura Morton, female    DOB: 08/10/1986, 35 y.o.   MRN: [id number]  Chief Complaint  Patient presents with  . Vaginal Itching    Pt states she started noticing redness on her labia 3 days ago, started having itching in the vaginal area last night     HPI Patient is in today for vaginal itching and mild discharge  VAGINAL DISCHARGE  Duration: days Discharge description: clear  Pruritus: yes Dysuria: no Malodorous: yes Urinary frequency: no Fevers: no Abdominal pain: no  Sexual activity: monogamous History of sexually transmitted diseases: no Recent antibiotic use: no Context: worse", "previous yeast infections  Treatments attempted: none  Past Medical History:  Diagnosis Date  . Asthma   . Environmental allergies   . Knee fracture, left   . Vertigo     No past surgical history on file.  Family History  Problem Relation Age of Onset  . Diabetes Mother   . Lupus Mother   . Hypertension Mother   . Hypertension Father   . Heart disease Sister   . Spina bifida Sister     Social History   Socioeconomic History  . Marital status: Single    Spouse name: Not on file  . Number of children: Not on file  . Years of education: Not on file  . Highest education level: Not on file  Occupational History  . Not on file  Tobacco Use  . Smoking status: Never Smoker  . Smokeless tobacco: Never Used  Vaping Use  . Vaping Use: Never used  Substance and Sexual Activity  . Alcohol use: Yes    Comment: on occasion  . Drug use: No  . Sexual activity: Yes    Partners: Male    Birth control/protection: Pill  Other Topics Concern  . Not on file  Social History Narrative  . Not on file   Social Determinants of Health   Financial Resource Strain: Not on file  Food Insecurity: Not on file  Transportation Needs: Not on file  Physical Activity: Not on file  Stress: Not on file  Social Connections: Not on file   Intimate Partner Violence: Not on file    Outpatient Medications Prior to Visit  Medication Sig Dispense Refill  . busPIRone (BUSPAR) 10 MG tablet Take 20 mg by mouth 3 (three) times daily.    . fluconazole (DIFLUCAN) 150 MG tablet Take 1 daily for 3 days and then monthly. 30 tablet 0  . hydrOXYzine (ATARAX/VISTARIL) 25 MG tablet Take 1 tablet (25 mg total) by mouth 3 (three) times daily as needed. 30 tablet 0  . albuterol (VENTOLIN HFA) 108 (90 Base) MCG/ACT inhaler Inhale 2 puffs into the lungs every 6 (six) hours as needed for wheezing or shortness of breath.    . Albuterol Sulfate (PROAIR HFA IN) Inhale 2 puffs into the lungs in the morning and at bedtime.    Thana Ates 0.15-30 MG-MCG tablet Take 1 tablet by mouth daily.    . clotrimazole (LOTRIMIN) 1 % cream Apply to affected area 2 times daily (Patient not taking: No sig reported) 28 g 0  . metroNIDAZOLE (FLAGYL) 500 MG tablet Take 1 tablet (500 mg total) by mouth 2 (two) times daily. (Patient not taking: Reported on 09/17/2020) 14 tablet 0  . PARAGARD INTRAUTERINE COPPER IUD IUD 1 each by Intrauterine route once.      No facility-administered medications prior to visit.    Allergies  Allergen Reactions  . Bactrim [Sulfamethoxazole-Trimethoprim]     Per patient request, never get prescribe this medication  . Lamictal [Lamotrigine]     Per patient request, never get prescribe this medication    Review of Systems  Constitutional: Negative for fatigue.  HENT: Negative.   Eyes: Negative.   Respiratory: Negative.   Cardiovascular: Negative.   Genitourinary: Negative for urgency.       Vaginal itching  Musculoskeletal: Negative.   Neurological: Negative.   Psychiatric/Behavioral: The patient is nervous/anxious (history of anxiety, currently stable).        Objective:    Physical Exam Vitals and nursing note reviewed.  Constitutional:      General: She is not in acute distress.    Appearance: Normal appearance.  HENT:      Head: Normocephalic.  Eyes:     Conjunctiva/sclera: Conjunctivae normal.  Cardiovascular:     Rate and Rhythm: Normal rate and regular rhythm.     Pulses: Normal pulses.     Heart sounds: Normal heart sounds.  Pulmonary:     Effort: Pulmonary effort is normal.     Breath sounds: Normal breath sounds.  Abdominal:     Palpations: Abdomen is soft.     Tenderness: There is no abdominal tenderness.  Musculoskeletal:     Cervical back: Normal range of motion.  Skin:    General: Skin is warm.  Neurological:     General: No focal deficit present.     Mental Status: She is alert and oriented to person, place, and time.  Psychiatric:        Mood and Affect: Mood normal.        Behavior: Behavior normal.        Thought Content: Thought content normal.        Judgment: Judgment normal.     BP 109/69   Pulse 89   Temp 98.4 F (36.9 C) (Oral)   Wt 144 lb 12.8 oz (65.7 kg)   LMP 11/01/2020 (Approximate)   SpO2 99%   Breastfeeding Unknown   BMI 26.48 kg/m  Wt Readings from Last 3 Encounters:  02/08/21 144 lb 12.8 oz (65.7 kg)  09/23/20 134 lb (60.8 kg)  09/17/20 131 lb 3.2 oz (59.5 kg)    Health Maintenance Due  Topic Date Due  . Hepatitis C Screening  Never done  . TETANUS/TDAP  07/18/2016  . COVID-19 Vaccine (3 - Booster for Pfizer series) 09/17/2020    There are no preventive care reminders to display for this patient.   Lab Results  Component Value Date   TSH 2.280 12/23/2018   Lab Results  Component Value Date   WBC 6.4 06/09/2020   HGB 13.7 06/09/2020   HCT 43.8 06/09/2020   MCV 87 06/09/2020   PLT 337 06/09/2020   Lab Results  Component Value Date   NA 139 07/08/2017   K 3.9 07/08/2017   CO2 24 07/08/2017   GLUCOSE 117 (H) 07/08/2017   BUN 9 07/08/2017   CREATININE 0.54 07/08/2017   BILITOT 0.2 (L) 07/08/2017   ALKPHOS 51 07/08/2017   AST 20 07/08/2017   ALT 12 (L) 07/08/2017   PROT 7.4 07/08/2017   ALBUMIN 4.2 07/08/2017   CALCIUM 9.2  07/08/2017   ANIONGAP 5 07/08/2017       Assessment & Plan:   Problem List Items Addressed This Visit      Respiratory   Asthma    Chronic, stable. Uses QVAR and albuterol prn. Refill  for albuterol sent to pharmacy. Will follow-up in 1-2 months at yearly physical.       Relevant Medications   albuterol (VENTOLIN HFA) 108 (90 Base) MCG/ACT inhaler   beclomethasone (QVAR) 40 MCG/ACT inhaler    Other Visit Diagnoses    Vaginal itching    -  Primary   Acute, symptomatic for 3 days. Has history of BV and yeast infections. Check u/a and wet prep   Relevant Orders   Urinalysis, Routine w reflex microscopic   WET PREP FOR TRICH, YEAST, CLUE   Need for pneumococcal vaccination       Pneumovax 23 given today   Relevant Orders   Pneumococcal polysaccharide vaccine 23-valent greater than or equal to 2yo subcutaneous/IM (Completed)   Pyuria       U/A and wet prep today. Wet prep negative. U/A positive for UTI   Relevant Orders   Urine Culture   Acute cystitis without hematuria       U/A positive for UTI with 2+ blood. Will add on urine culture. Macrobid sent to pharmacy. Use back up birth control. F/U if symptoms do not improve or worsen       Meds ordered this encounter  Medications  . albuterol (VENTOLIN HFA) 108 (90 Base) MCG/ACT inhaler    Sig: Inhale 2 puffs into the lungs every 6 (six) hours as needed for wheezing or shortness of breath.    Dispense:  6.7 g    Refill:  1  . ALTAVERA 0.15-30 MG-MCG tablet    Sig: Take 1 tablet by mouth daily.    Dispense:  28 tablet    Refill:  12  . beclomethasone (QVAR) 40 MCG/ACT inhaler    Sig: Inhale 2 puffs into the lungs 2 (two) times daily.    Dispense:  1 each    Refill:  0  . nitrofurantoin, macrocrystal-monohydrate, (MACROBID) 100 MG capsule    Sig: Take 1 capsule (100 mg total) by mouth 2 (two) times daily.    Dispense:  10 capsule    Refill:  0     Charyl Dancer, NP

## 2021-02-09 MED ORDER — AEROCHAMBER PLUS MISC: 2 refills | Status: AC

## 2021-02-09 MED ORDER — TRIAMCINOLONE ACETONIDE 0.1 % EX CREA: 1.0000 "application " | TOPICAL_CREAM | Freq: Two times a day (BID) | CUTANEOUS | 0 refills | Status: DC

## 2021-02-09 MED ORDER — QVAR REDIHALER 40 MCG/ACT IN AERB: 2.0000 | INHALATION_SPRAY | Freq: Two times a day (BID) | RESPIRATORY_TRACT | 3 refills | Status: DC

## 2021-02-10 LAB — URINE CULTURE

## 2021-04-12 ENCOUNTER — Encounter: Payer: Managed Care, Other (non HMO) | Admitting: Nurse Practitioner

## 2021-04-14 ENCOUNTER — Ambulatory Visit: Payer: Managed Care, Other (non HMO) | Admitting: Nurse Practitioner

## 2021-04-15 ENCOUNTER — Encounter: Payer: Self-pay | Admitting: Nurse Practitioner

## 2021-04-15 ENCOUNTER — Ambulatory Visit
Admission: RE | Admit: 2021-04-15 | Discharge: 2021-04-15 | Disposition: A | Discharge status: Home / Self Care | Payer: Managed Care, Other (non HMO) | Attending: Nurse Practitioner | Admitting: Nurse Practitioner

## 2021-04-15 ENCOUNTER — Ambulatory Visit
Admission: RE | Admit: 2021-04-15 | Discharge: 2021-04-15 | Disposition: A | Discharge status: Home / Self Care | Payer: Managed Care, Other (non HMO) | Source: Ambulatory Visit | Attending: Nurse Practitioner | Admitting: Nurse Practitioner

## 2021-04-15 ENCOUNTER — Other Ambulatory Visit: Payer: Self-pay

## 2021-04-15 ENCOUNTER — Ambulatory Visit (INDEPENDENT_AMBULATORY_CARE_PROVIDER_SITE_OTHER): Payer: Managed Care, Other (non HMO) | Admitting: Nurse Practitioner

## 2021-04-15 VITALS — BP 119/80 | HR 75 | Temp 99.0°F | Wt 140.0 lb

## 2021-04-15 DIAGNOSIS — M25562 Pain in left knee: Secondary | ICD-10-CM | POA: Insufficient documentation

## 2021-04-15 DIAGNOSIS — M25561 Pain in right knee: Secondary | ICD-10-CM

## 2021-04-15 NOTE — Progress Notes (Signed)
BP 119/80   Pulse 75   Temp 99 F (37.2 C)   Wt 140 lb (63.5 kg)   LMP 08/14/2020   SpO2 99%   BMI 25.61 kg/m    Subjective:    Patient ID: Laura Morton, female    DOB: 07/18/1986, 35 y.o.   MRN: [id number]  HPI: Laura Morton is a 35 y.o. female  Chief Complaint  Patient presents with  . Knee Pain    Bilateral, was seen by urgent care, was told to follow up with pcp. May need MRI  Patient went running 5 days in a row, a few days later she started having the pain in both knees. She has been unable to go to work due to the pain needs to discuss leave   KNEE PAIN- Patient states that she has been having bilateral knee pain since she went running 5 days in a row.  Patient was seen in UC and received steroid dose pack which is helping some but she hasn't completed the whole course.  She is continuing to take them. Walking and standing is very difficult at this time.  Duration: months Involved knee: bilateral Mechanism of injury: overuse Location:anterior Onset: sudden Severity: 8/10  Quality:  aching Frequency: intermittent Radiation: no Aggravating factors: weight bearing and walking  Alleviating factors: sitting and ibuprofen  Status: stable Treatments attempted: rest and ibuprofen  Relief with NSAIDs?:  moderate Weakness with weight bearing or walking: no Sensation of giving way: no Locking: no Popping: no Bruising: no Swelling: no Redness: no Paresthesias/decreased sensation: no Fevers: no   Denies HA, CP, SOB, dizziness, palpitations, visual changes, and lower extremity swelling.  Relevant past medical, surgical, family and social history reviewed and updated as indicated. Interim medical history since our last visit reviewed. Allergies and medications reviewed and updated.  Review of Systems  Eyes: Negative for visual disturbance.  Respiratory: Negative for cough, chest tightness and shortness of breath.   Cardiovascular: Negative for chest pain, palpitations  and leg swelling.  Musculoskeletal:       Bilteral medial need pain  Neurological: Negative for dizziness and headaches.    Per HPI unless specifically indicated above     Objective:    BP 119/80   Pulse 75   Temp 99 F (37.2 C)   Wt 140 lb (63.5 kg)   LMP 08/14/2020   SpO2 99%   BMI 25.61 kg/m   Wt Readings from Last 3 Encounters:  04/15/21 140 lb (63.5 kg)  02/08/21 144 lb 12.8 oz (65.7 kg)  09/23/20 134 lb (60.8 kg)    Physical Exam Vitals and nursing note reviewed.  Constitutional:      General: She is not in acute distress.    Appearance: Normal appearance. She is normal weight. She is not ill-appearing, toxic-appearing or diaphoretic.  HENT:     Head: Normocephalic.     Right Ear: External ear normal.     Left Ear: External ear normal.     Nose: Nose normal.     Mouth/Throat:     Mouth: Mucous membranes are moist.     Pharynx: Oropharynx is clear.  Eyes:     General:        Right eye: No discharge.        Left eye: No discharge.     Extraocular Movements: Extraocular movements intact.     Conjunctiva/sclera: Conjunctivae normal.     Pupils: Pupils are equal, round, and reactive to light.  Cardiovascular:  Rate and Rhythm: Normal rate and regular rhythm.     Heart sounds: No murmur heard.   Pulmonary:     Effort: Pulmonary effort is normal. No respiratory distress.     Breath sounds: Normal breath sounds. No wheezing or rales.  Musculoskeletal:     Cervical back: Normal range of motion and neck supple.     Right knee: No MCL laxity (pain with palpation over MCL).     Left knee: No MCL laxity (pain with palpation over MCL). Skin:    General: Skin is warm and dry.     Capillary Refill: Capillary refill takes less than 2 seconds.  Neurological:     General: No focal deficit present.     Mental Status: She is alert and oriented to person, place, and time. Mental status is at baseline.  Psychiatric:        Mood and Affect: Mood normal.         Behavior: Behavior normal.        Thought Content: Thought content normal.        Judgment: Judgment normal.     Results for orders placed or performed in visit on 02/08/21  WET PREP FOR [place], YEAST, CLUE   Specimen: Urine   Urine  Result Value Ref Range   Trichomonas Exam Negative Negative   Yeast Exam Negative Negative   Clue Cell Exam Negative Negative  Urine Culture   Specimen: Urine   UR  Result Value Ref Range   Urine Culture, Routine Final report    Organism ID, Bacteria Comment   Microscopic Examination   Urine  Result Value Ref Range   WBC, UA 6-10 (A) 0 - 5 /hpf   RBC 3-10 (A) 0 - 2 /hpf   Epithelial Cells (non renal) 0-10 0 - 10 /hpf   Mucus, UA Present (A) Not Estab.   Bacteria, UA Moderate (A) None seen/Few  Urinalysis, Routine w reflex microscopic  Result Value Ref Range   Specific Gravity, UA 1.020 1.005 - 1.030   pH, UA 7.0 5.0 - 7.5   Color, UA Yellow Yellow   Appearance Ur Clear Clear   Leukocytes,UA 1+ (A) Negative   Protein,UA Trace (A) Negative/Trace   Glucose, UA Negative Negative   Ketones, UA Negative Negative   RBC, UA 2+ (A) Negative   Bilirubin, UA Negative Negative   Urobilinogen, Ur 0.2 0.2 - 1.0 mg/dL   Nitrite, UA Negative Negative   Microscopic Examination See below:       Assessment & Plan:   Problem List Items Addressed This Visit   None   Visit Diagnoses    Acute pain of both knees    -  Primary   Will order xrays of both knees. Will likely need MRI for possible MCL injury. If xrays are negative will refer patient to Orthopedics.    Relevant Orders   DG Knee Complete 4 Views Left   DG Knee Complete 4 Views Right       Follow up plan: Return if symptoms worsen or fail to improve.    A total of 20 minutes were spent on this encounter today.  When total time is documented, this includes both the face-to-face and non-face-to-face time personally spent before, during and after the visit on the date of the encounter.

## 2021-04-17 DIAGNOSIS — M25561 Pain in right knee: Secondary | ICD-10-CM

## 2021-04-17 NOTE — Progress Notes (Signed)
Hi Kay. Your xray of your knees was normal.  I think you should see Orthopedics for further evaluation and treatment.  Please let me know if you need a referral.

## 2021-05-13 ENCOUNTER — Encounter: Payer: Self-pay | Admitting: Nurse Practitioner

## 2021-05-13 ENCOUNTER — Other Ambulatory Visit: Payer: Self-pay

## 2021-05-13 ENCOUNTER — Ambulatory Visit (INDEPENDENT_AMBULATORY_CARE_PROVIDER_SITE_OTHER): Payer: Managed Care, Other (non HMO) | Admitting: Nurse Practitioner

## 2021-05-13 VITALS — BP 109/77 | HR 80 | Temp 98.6°F

## 2021-05-13 DIAGNOSIS — R350 Frequency of micturition: Secondary | ICD-10-CM | POA: Diagnosis not present

## 2021-05-13 DIAGNOSIS — N3001 Acute cystitis with hematuria: Secondary | ICD-10-CM | POA: Diagnosis not present

## 2021-05-13 LAB — MICROSCOPIC EXAMINATION: WBC, UA: >30 /hpf — AB (ref 0–5)

## 2021-05-13 LAB — URINALYSIS, ROUTINE W REFLEX MICROSCOPIC

## 2021-05-13 LAB — WET PREP FOR TRICH, YEAST, CLUE
Clue Cell Exam: NEGATIVE
Trichomonas Exam: NEGATIVE
Yeast Exam: NEGATIVE

## 2021-05-13 MED ORDER — FLUCONAZOLE 150 MG PO TABS: 150.0000 mg | ORAL_TABLET | Freq: Once | ORAL | 0 refills | Status: AC

## 2021-05-13 MED ORDER — NITROFURANTOIN MONOHYD MACRO 100 MG PO CAPS: 100.0000 mg | ORAL_CAPSULE | Freq: Two times a day (BID) | ORAL | 0 refills | Status: DC

## 2021-05-13 NOTE — Progress Notes (Signed)
Acute Office Visit  Subjective:    Patient ID: Laura Morton, female    DOB: [birth year], 35 y.o.   MRN: [id number]  Chief Complaint  Patient presents with   Urinary pain    Started last night, started taking AZO    HPI Patient is in today for urinary pain  URINARY SYMPTOMS  Dysuria: yes Urinary frequency: yes Urgency: yes Small volume voids: yes Symptom severity:  severe Urinary incontinence: no Foul odor: no Hematuria: no Abdominal pain: no Back pain: no Suprapubic pain/pressure: no Flank pain: no Fever:  no Vomiting: no Relief with cranberry juice:n/a Relief with pyridium:  n/a Status: worse Previous urinary tract infection: yes Recurrent urinary tract infection: no Sexual activity: monogomous History of sexually transmitted disease: no Treatments attempted:  azos     Past Medical History:  Diagnosis Date   Asthma    Environmental allergies    Knee fracture, left    Vertigo     History reviewed. No pertinent surgical history.  Family History  Problem Relation Age of Onset   Diabetes Mother    Lupus Mother    Hypertension Mother    Hypertension Father    Heart disease Sister    Spina bifida Sister     Social History   Socioeconomic History   Marital status: Significant Other    Spouse name: Not on file   Number of children: Not on file   Years of education: Not on file   Highest education level: Not on file  Occupational History   Not on file  Tobacco Use   Smoking status: Never   Smokeless tobacco: Never  Vaping Use   Vaping Use: Never used  Substance and Sexual Activity   Alcohol use: Yes    Comment: on occasion   Drug use: No   Sexual activity: Yes    Partners: Male    Birth control/protection: Pill  Other Topics Concern   Not on file  Social History Narrative   Not on file   Social Determinants of Health   Financial Resource Strain: Not on file  Food Insecurity: Not on file  Transportation Needs: Not on file  Physical  Activity: Not on file  Stress: Not on file  Social Connections: Not on file  Intimate Partner Violence: Not on file    Outpatient Medications Prior to Visit  Medication Sig Dispense Refill   albuterol (VENTOLIN HFA) 108 (90 Base) MCG/ACT inhaler Inhale 2 puffs into the lungs every 6 (six) hours as needed for wheezing or shortness of breath. 6.7 g 1   beclomethasone (QVAR REDIHALER) 40 MCG/ACT inhaler Inhale 2 puffs into the lungs 2 (two) times daily. 1 each 3   beclomethasone (QVAR) 40 MCG/ACT inhaler Inhale 2 puffs into the lungs 2 (two) times daily. 1 each 0   busPIRone (BUSPAR) 10 MG tablet Take 20 mg by mouth 3 (three) times daily.     hydrOXYzine (ATARAX/VISTARIL) 25 MG tablet Take 1 tablet (25 mg total) by mouth 3 (three) times daily as needed. 30 tablet 0   Spacer/Aero-Holding Chambers (AEROCHAMBER PLUS) inhaler Use as instructed 1 each 2   triamcinolone (KENALOG) 0.1 % Apply 1 application topically 2 (two) times daily. 30 g 0   fluconazole (DIFLUCAN) 150 MG tablet Take 1 daily for 3 days and then monthly. 30 tablet 0   nitrofurantoin, macrocrystal-monohydrate, (MACROBID) 100 MG capsule Take 1 capsule (100 mg total) by mouth 2 (two) times daily. 10 capsule 0   Spacer/Aero-Holding Chambers (AEROCHAMBER  PLUS) inhaler Use as instructed 1 each 2   ALTAVERA 0.15-30 MG-MCG tablet Take 1 tablet by mouth daily. (Patient not taking: Reported on 05/13/2021) 28 tablet 12   No facility-administered medications prior to visit.    Allergies  Allergen Reactions   Bactrim [Sulfamethoxazole-Trimethoprim]     Per patient request, never get prescribe this medication   Lamictal [Lamotrigine]     Per patient request, never get prescribe this medication    Review of Systems  Constitutional: Negative.   Respiratory: Negative.    Cardiovascular: Negative.   Gastrointestinal: Negative.   Genitourinary:  Positive for decreased urine volume, dysuria, frequency and urgency. Negative for vaginal bleeding  and vaginal discharge.  Musculoskeletal: Negative.   Skin: Negative.   Neurological: Negative.       Objective:    Physical Exam Vitals and nursing note reviewed.  Constitutional:      General: She is not in acute distress.    Appearance: Normal appearance.  HENT:     Head: Normocephalic.  Eyes:     Conjunctiva/sclera: Conjunctivae normal.  Cardiovascular:     Rate and Rhythm: Normal rate and regular rhythm.     Pulses: Normal pulses.     Heart sounds: Normal heart sounds.  Pulmonary:     Effort: Pulmonary effort is normal.     Breath sounds: Normal breath sounds.  Abdominal:     Palpations: Abdomen is soft.     Tenderness: There is no abdominal tenderness. There is no right CVA tenderness or left CVA tenderness.  Musculoskeletal:     Cervical back: Normal range of motion.  Skin:    General: Skin is warm.  Neurological:     General: No focal deficit present.     Mental Status: She is alert and oriented to person, place, and time.  Psychiatric:        Mood and Affect: Mood normal.        Behavior: Behavior normal.        Thought Content: Thought content normal.        Judgment: Judgment normal.    BP 109/77   Pulse 80   Temp 98.6 F (37 C) (Oral)   LMP 08/14/2020   SpO2 99%  Wt Readings from Last 3 Encounters:  04/15/21 140 lb (63.5 kg)  02/08/21 144 lb 12.8 oz (65.7 kg)  09/23/20 134 lb (60.8 kg)    Health Maintenance Due  Topic Date Due   Pneumococcal Vaccine 3-65 Years old (1 - PCV) Never done   Hepatitis C Screening  Never done   TETANUS/TDAP  07/18/2016   COVID-19 Vaccine (3 - Booster for Pfizer series) 08/17/2020    There are no preventive care reminders to display for this patient.   Lab Results  Component Value Date   TSH 2.280 12/23/2018   Lab Results  Component Value Date   WBC 6.4 06/09/2020   HGB 13.7 06/09/2020   HCT 43.8 06/09/2020   MCV 87 06/09/2020   PLT 337 06/09/2020   Lab Results  Component Value Date   NA 139 07/08/2017    K 3.9 07/08/2017   CO2 24 07/08/2017   GLUCOSE 117 (H) 07/08/2017   BUN 9 07/08/2017   CREATININE 0.54 07/08/2017   BILITOT 0.2 (L) 07/08/2017   ALKPHOS 51 07/08/2017   AST 20 07/08/2017   ALT 12 (L) 07/08/2017   PROT 7.4 07/08/2017   ALBUMIN 4.2 07/08/2017   CALCIUM 9.2 07/08/2017   ANIONGAP 5 07/08/2017  No results found for: CHOL No results found for: HDL No results found for: LDLCALC No results found for: TRIG No results found for: CHOLHDL No results found for: HGBA1C     Assessment & Plan:   Problem List Items Addressed This Visit   None Visit Diagnoses     Urinary frequency    -  Primary   U/A positve for moderate bacteria. Wet prep negative. See UTI plan   Relevant Orders   Urinalysis, Routine w reflex microscopic   WET PREP FOR TRICH, YEAST, CLUE   Acute cystitis with hematuria       U/A positve for moderate bacteria. Will treat for UTI with macrobid and diflucan to prevent yeast. Increase fluids. Continue azos. F/U if symptoms worsen.    Relevant Orders   Urine Culture        Meds ordered this encounter  Medications   nitrofurantoin, macrocrystal-monohydrate, (MACROBID) 100 MG capsule    Sig: Take 1 capsule (100 mg total) by mouth 2 (two) times daily.    Dispense:  10 capsule    Refill:  0   fluconazole (DIFLUCAN) 150 MG tablet    Sig: Take 1 tablet (150 mg total) by mouth once for 1 dose.    Dispense:  1 tablet    Refill:  0     Charyl Dancer, NP

## 2021-05-15 LAB — URINE CULTURE

## 2021-05-19 ENCOUNTER — Encounter: Payer: Managed Care, Other (non HMO) | Admitting: Nurse Practitioner

## 2021-05-24 ENCOUNTER — Encounter: Payer: Managed Care, Other (non HMO) | Admitting: Nurse Practitioner

## 2021-05-24 NOTE — Progress Notes (Deleted)
LMP 08/14/2020    Subjective:    Patient ID: Laura Morton, female    DOB: 1986/04/24, 35 y.o.   MRN: [id number]  HPI: Laura Morton is a 35 y.o. female presenting on 05/24/2021 for comprehensive medical examination. Current medical complaints include:{Blank single:19197::"none","***"}  She currently lives with: Menopausal Symptoms: {Blank single:19197::"yes","no"}  Depression Screen done today and results listed below:  Depression screen [facility] 2/9 05/13/2021 09/17/2020 12/10/2018  Decreased Interest 2 0 0  Down, Depressed, Hopeless 2 0 0  PHQ - 2 Score 4 0 0  Altered sleeping 3 - 1  Tired, decreased energy 0 - 0  Change in appetite 0 - 2  Feeling bad or failure about yourself  0 - 0  Trouble concentrating 0 - 0  Moving slowly or fidgety/restless 0 - 0  Suicidal thoughts 0 - 0  PHQ-9 Score 7 - 3    The patient {has/does not have:19849} a history of falls. I {did/did not:19850} complete a risk assessment for falls. A plan of care for falls {was/was not:19852} documented.   Past Medical History:  Past Medical History:  Diagnosis Date   Asthma    Environmental allergies    Knee fracture, left    Vertigo     Surgical History:  No past surgical history on file.  Medications:  Current Outpatient Medications on File Prior to Visit  Medication Sig   albuterol (VENTOLIN HFA) 108 (90 Base) MCG/ACT inhaler Inhale 2 puffs into the lungs every 6 (six) hours as needed for wheezing or shortness of breath.   ALTAVERA 0.15-30 MG-MCG tablet Take 1 tablet by mouth daily. (Patient not taking: Reported on 05/13/2021)   beclomethasone (QVAR REDIHALER) 40 MCG/ACT inhaler Inhale 2 puffs into the lungs 2 (two) times daily.   beclomethasone (QVAR) 40 MCG/ACT inhaler Inhale 2 puffs into the lungs 2 (two) times daily.   busPIRone (BUSPAR) 10 MG tablet Take 20 mg by mouth 3 (three) times daily.   hydrOXYzine (ATARAX/VISTARIL) 25 MG tablet Take 1 tablet (25 mg total) by mouth 3 (three) times daily as  needed.   nitrofurantoin, macrocrystal-monohydrate, (MACROBID) 100 MG capsule Take 1 capsule (100 mg total) by mouth 2 (two) times daily.   Spacer/Aero-Holding Chambers (AEROCHAMBER PLUS) inhaler Use as instructed   triamcinolone (KENALOG) 0.1 % Apply 1 application topically 2 (two) times daily.   No current facility-administered medications on file prior to visit.    Allergies:  Allergies  Allergen Reactions   Bactrim [Sulfamethoxazole-Trimethoprim]     Per patient request, never get prescribe this medication   Lamictal [Lamotrigine]     Per patient request, never get prescribe this medication    Social History:  Social History   Socioeconomic History   Marital status: Significant Other    Spouse name: Not on file   Number of children: Not on file   Years of education: Not on file   Highest education level: Not on file  Occupational History   Not on file  Tobacco Use   Smoking status: Never   Smokeless tobacco: Never  Vaping Use   Vaping Use: Never used  Substance and Sexual Activity   Alcohol use: Yes    Comment: on occasion   Drug use: No   Sexual activity: Yes    Partners: Male    Birth control/protection: Pill  Other Topics Concern   Not on file  Social History Narrative   Not on file   Social Determinants of Health   Financial Resource Strain: Not  on file  Food Insecurity: Not on file  Transportation Needs: Not on file  Physical Activity: Not on file  Stress: Not on file  Social Connections: Not on file  Intimate Partner Violence: Not on file   Social History   Tobacco Use  Smoking Status Never  Smokeless Tobacco Never   Social History   Substance and Sexual Activity  Alcohol Use Yes   Comment: on occasion    Family History:  Family History  Problem Relation Age of Onset   Diabetes Mother    Lupus Mother    Hypertension Mother    Hypertension Father    Heart disease Sister    Spina bifida Sister     Past medical history, surgical  history, medications, allergies, family history and social history reviewed with patient today and changes made to appropriate areas of the chart.   ROS All other ROS negative except what is listed above and in the HPI.      Objective:    LMP 08/14/2020   Wt Readings from Last 3 Encounters:  04/15/21 140 lb (63.5 kg)  02/08/21 144 lb 12.8 oz (65.7 kg)  09/23/20 134 lb (60.8 kg)    Physical Exam  Results for orders placed or performed in visit on 05/13/21  WET PREP FOR [place], YEAST, CLUE   Specimen: Sterile Swab   Sterile Swab  Result Value Ref Range   Trichomonas Exam Negative Negative   Yeast Exam Negative Negative   Clue Cell Exam Negative Negative  Urine Culture   Specimen: Urine   UR  Result Value Ref Range   Urine Culture, Routine Final report    Organism ID, Bacteria Comment   Microscopic Examination   Urine  Result Value Ref Range   WBC, UA >30 (A) 0 - 5 /hpf   RBC 11-30 (A) 0 - 2 /hpf   Epithelial Cells (non renal) 0-10 0 - 10 /hpf   Renal Epithel, UA 0-10 (A) None seen /hpf   Casts Present (A) None seen /lpf   Cast Type Hyaline casts N/A   Mucus, UA Present Not Estab.   Bacteria, UA Moderate (A) None seen/Few  Urinalysis, Routine w reflex microscopic  Result Value Ref Range   Specific Gravity, UA CANCELED    pH, UA CANCELED    Color, UA Orange Yellow   Appearance Ur Hazy (A) Clear   Protein,UA CANCELED    Glucose, UA CANCELED    Ketones, UA CANCELED    Microscopic Examination See below:       Assessment & Plan:   Problem List Items Addressed This Visit   None    Follow up plan: No follow-ups on file.   LABORATORY TESTING:  - Pap smear: up to date  IMMUNIZATIONS:   - Tdap: Tetanus vaccination status reviewed: {tetanus status:315746}. - Influenza: Postponed to flu season - Pneumovax: Up to date - Prevnar: Not applicable - HPV: {Blank single:19197::"Up to date","Administered today","Not applicable","Refused","Given elsewhere"} - Zostavax  vaccine: Not applicable  SCREENING: -Mammogram: Not applicable  - Colonoscopy: Not applicable  - Bone Density: Not applicable  -Hearing Test: Not applicable  -Spirometry: Not applicable   PATIENT COUNSELING:   Advised to take 1 mg of folate supplement per day if capable of pregnancy.   Sexuality: Discussed sexually transmitted diseases, partner selection, use of condoms, avoidance of unintended pregnancy  and contraceptive alternatives.   Advised to avoid cigarette smoking.  I discussed with the patient that most people either abstain from alcohol or drink  within safe limits (<=14/week and <=4 drinks/occasion for males, <=7/weeks and <= 3 drinks/occasion for females) and that the risk for alcohol disorders and other health effects rises proportionally with the number of drinks per week and how often a drinker exceeds daily limits.  Discussed cessation/primary prevention of drug use and availability of treatment for abuse.   Diet: Encouraged to adjust caloric intake to maintain  or achieve ideal body weight, to reduce intake of dietary saturated fat and total fat, to limit sodium intake by avoiding high sodium foods and not adding table salt, and to maintain adequate dietary potassium and calcium preferably from fresh fruits, vegetables, and low-fat dairy products.    stressed the importance of regular exercise  Injury prevention: Discussed safety belts, safety helmets, smoke detector, smoking near bedding or upholstery.   Dental health: Discussed importance of regular tooth brushing, flossing, and dental visits.    NEXT PREVENTATIVE PHYSICAL DUE IN 1 YEAR. No follow-ups on file.

## 2021-09-07 ENCOUNTER — Ambulatory Visit (INDEPENDENT_AMBULATORY_CARE_PROVIDER_SITE_OTHER): Payer: Managed Care, Other (non HMO) | Admitting: Nurse Practitioner

## 2021-09-07 ENCOUNTER — Encounter: Payer: Self-pay | Admitting: Nurse Practitioner

## 2021-09-07 ENCOUNTER — Other Ambulatory Visit: Payer: Self-pay

## 2021-09-07 VITALS — BP 103/69 | HR 96 | Ht 62.0 in | Wt 149.6 lb

## 2021-09-07 DIAGNOSIS — B379 Candidiasis, unspecified: Secondary | ICD-10-CM | POA: Diagnosis not present

## 2021-09-07 DIAGNOSIS — R8290 Unspecified abnormal findings in urine: Secondary | ICD-10-CM | POA: Diagnosis not present

## 2021-09-07 DIAGNOSIS — N898 Other specified noninflammatory disorders of vagina: Secondary | ICD-10-CM | POA: Diagnosis not present

## 2021-09-07 LAB — URINALYSIS, ROUTINE W REFLEX MICROSCOPIC
Bilirubin, UA: NEGATIVE
Glucose, UA: NEGATIVE
Ketones, UA: NEGATIVE
Nitrite, UA: NEGATIVE
Protein,UA: NEGATIVE
Specific Gravity, UA: 1.025 (ref 1.005–1.030)
Urobilinogen, Ur: 0.2 mg/dL (ref 0.2–1.0)
pH, UA: 6 (ref 5.0–7.5)

## 2021-09-07 LAB — WET PREP FOR TRICH, YEAST, CLUE
Clue Cell Exam: NEGATIVE
Trichomonas Exam: NEGATIVE
Yeast Exam: NEGATIVE

## 2021-09-07 LAB — MICROSCOPIC EXAMINATION

## 2021-09-07 MED ORDER — NITROFURANTOIN MONOHYD MACRO 100 MG PO CAPS: 100.0000 mg | ORAL_CAPSULE | Freq: Two times a day (BID) | ORAL | 0 refills | Status: DC

## 2021-09-07 MED ORDER — NYSTATIN 100000 UNIT/GM EX CREA: 1.0000 "application " | TOPICAL_CREAM | Freq: Two times a day (BID) | CUTANEOUS | 1 refills | Status: DC

## 2021-09-07 NOTE — Progress Notes (Signed)
BP 103/69   Pulse 96   Ht 5\' 2"  (1.575 m)   Wt 149 lb 9.6 oz (67.9 kg)   SpO2 100%   BMI 27.36 kg/m    Subjective:    Patient ID: Laura Morton, female    DOB: 04/20/1986, 35 y.o.   MRN: [id number]  HPI: Laura Morton is a 35 y.o. female  Chief Complaint  Patient presents with   Vaginitis   Patient states that she has been using [place] to help during intercourse. Patient states that her outside vaginal area is chapped and very sore.  She was recently intimate and it was very uncomfortable for her.  She has not had this before and unsure of the cause.  Has been using hydrocortisone on the outside but it isn't helping.  States it hasn't improved.  Denies pain with urination.  She does have some vaginal discharge.    Relevant past medical, surgical, family and social history reviewed and updated as indicated. Interim medical history since our last visit reviewed. Allergies and medications reviewed and updated.  Review of Systems  Genitourinary:  Positive for dyspareunia, vaginal discharge and vaginal pain. Negative for dysuria.   Per HPI unless specifically indicated above     Objective:    BP 103/69   Pulse 96   Ht 5\' 2"  (1.575 m)   Wt 149 lb 9.6 oz (67.9 kg)   SpO2 100%   BMI 27.36 kg/m   Wt Readings from Last 3 Encounters:  09/07/21 149 lb 9.6 oz (67.9 kg)  04/15/21 140 lb (63.5 kg)  02/08/21 144 lb 12.8 oz (65.7 kg)    Physical Exam Vitals and nursing note reviewed.  Constitutional:      General: She is not in acute distress.    Appearance: Normal appearance. She is normal weight. She is not ill-appearing, toxic-appearing or diaphoretic.  HENT:     Head: Normocephalic.     Right Ear: External ear normal.     Left Ear: External ear normal.     Nose: Nose normal.     Mouth/Throat:     Mouth: Mucous membranes are moist.     Pharynx: Oropharynx is clear.  Eyes:     General:        Right eye: No discharge.        Left eye: No discharge.     Extraocular Movements:  Extraocular movements intact.     Conjunctiva/sclera: Conjunctivae normal.     Pupils: Pupils are equal, round, and reactive to light.  Cardiovascular:     Rate and Rhythm: Normal rate and regular rhythm.     Heart sounds: No murmur heard. Pulmonary:     Effort: Pulmonary effort is normal. No respiratory distress.     Breath sounds: Normal breath sounds. No wheezing or rales.  Musculoskeletal:     Cervical back: Normal range of motion and neck supple.  Skin:    General: Skin is warm and dry.     Capillary Refill: Capillary refill takes less than 2 seconds.  Neurological:     General: No focal deficit present.     Mental Status: She is alert and oriented to person, place, and time. Mental status is at baseline.  Psychiatric:        Mood and Affect: Mood normal.        Behavior: Behavior normal.        Thought Content: Thought content normal.        Judgment: Judgment normal.  Results for orders placed or performed in visit on 05/13/21  WET PREP FOR [place], YEAST, CLUE   Specimen: Sterile Swab   Sterile Swab  Result Value Ref Range   Trichomonas Exam Negative Negative   Yeast Exam Negative Negative   Clue Cell Exam Negative Negative  Urine Culture   Specimen: Urine   UR  Result Value Ref Range   Urine Culture, Routine Final report    Organism ID, Bacteria Comment   Microscopic Examination   Urine  Result Value Ref Range   WBC, UA >30 (A) 0 - 5 /hpf   RBC 11-30 (A) 0 - 2 /hpf   Epithelial Cells (non renal) 0-10 0 - 10 /hpf   Renal Epithel, UA 0-10 (A) None seen /hpf   Casts Present (A) None seen /lpf   Cast Type Hyaline casts N/A   Mucus, UA Present Not Estab.   Bacteria, UA Moderate (A) None seen/Few  Urinalysis, Routine w reflex microscopic  Result Value Ref Range   Specific Gravity, UA CANCELED    pH, UA CANCELED    Color, UA Orange Yellow   Appearance Ur Hazy (A) Clear   Protein,UA CANCELED    Glucose, UA CANCELED    Ketones, UA CANCELED    Microscopic  Examination See below:       Assessment & Plan:   Problem List Items Addressed This Visit   None Visit Diagnoses     Yeast infection    -  Primary   Will send nystatin cream for patient. Wet Prep negative for yeast. UA had 2+ bacteria. Will send for culture and treat patient based on culture results.   Vaginal itching       Relevant Orders   WET PREP FOR TRICH, YEAST, CLUE   Urinalysis, Routine w reflex microscopic   Abnormal urinalysis            Follow up plan: Return if symptoms worsen or fail to improve.

## 2021-09-07 NOTE — Addendum Note (Signed)
Addended by: Jon Billings on: 09/07/2021 01:26 PM   Modules accepted: Orders

## 2021-09-07 NOTE — Progress Notes (Signed)
Please let patient know that she does not have a yeast infection. However it does look like she has a UTI.  This could be the cause of her symptoms.  I have sent her nystatin cream and macrobid to the pharmacy.  Patient states it is okay to leave her a voicemail.

## 2021-09-12 LAB — URINE CULTURE

## 2021-09-12 NOTE — Progress Notes (Signed)
Hi Laura Morton.  No evidence of UTI.  Complete the course of antibiotics and use the cream. If symptoms do not improve FU in office.

## 2021-11-08 NOTE — Progress Notes (Signed)
BP 109/76    Pulse 89    Temp 98.7 F (37.1 C) (Oral)    Ht '5\' 2"'  (1.575 m)    Wt 145 lb (65.8 kg)    LMP 11/03/2021 (Exact Date)    SpO2 99%    BMI 26.52 kg/m    Subjective:    Patient ID: Laura Morton, female    DOB: 01-03-1986, 35 y.o.   MRN: [id number]  HPI: Laura Morton is a 35 y.o. female  Chief Complaint  Patient presents with   Depression    Pt states she has feels like she has increased depression at the end of her menstrual cycles. States this has been happening for a while. Currently sees psychiatry but is requesting to have hormone levels checked. Wondering if she has PPD.    Sleep Walking    Pt states she has been sleep walking for about the last 10 years. States she usually gets up and eats. States she is wanting to know why she could be doing this. Wondering if she needs a sleep study done.    Bladder Issue    Pt states she has noticed that she urinates a little bit when sneezing. States she has noticed this happening for the last few years.    SLEEP WALKING Pt states she has been sleep walking for about the last 10 years. States she usually gets up and eats. States she is wanting to know why she could be doing this. Wondering if she needs a sleep study done.  States her psychiatrist also wants her to have a sleep study done to also see if she have sleep apnea.  BLADDER ISSUES Pt states she has noticed that she urinates a little bit when sneezing. States she has noticed this happening for the last few years.   DEPRESSION Pt states she has feels like she has increased depression at the end of her menstrual cycles. States this has been happening for a while. Currently sees psychiatry but is requesting to have hormone levels checked. Wondering if she has PPD.  States that her depression is a lot worse after her cycle. She is having regular periods. States that she bleeds heaving for 8 days.  Having lots of moodiness.  [place] Office Visit from 11/09/2021 in [facility]  PHQ-9 Total Score 10      GAD 7 : Generalized Anxiety Score 11/09/2021 09/07/2021 12/10/2018  Nervous, Anxious, on Edge '2 2 2  ' Control/stop worrying '2 2 2  ' Worry too much - different things '2 2 2  ' Trouble relaxing '2 2 1  ' Restless 2 2 0  Easily annoyed or irritable 2 2 0  Afraid - awful might happen 1 1 0  Total GAD 7 Score '13 13 7  ' Anxiety Difficulty Somewhat difficult Not difficult at all Somewhat difficult      Relevant past medical, surgical, family and social history reviewed and updated as indicated. Interim medical history since our last visit reviewed. Allergies and medications reviewed and updated.  Review of Systems  Genitourinary:        Bladder leakage  Neurological:        Sleeping walking  Psychiatric/Behavioral:  Positive for dysphoric mood. Negative for suicidal ideas. The patient is nervous/anxious.    Per HPI unless specifically indicated above     Objective:    BP 109/76    Pulse 89    Temp 98.7 F (37.1 C) (Oral)    Ht '5\' 2"'  (1.575 m)  Wt 145 lb (65.8 kg)    LMP 11/03/2021 (Exact Date)    SpO2 99%    BMI 26.52 kg/m   Wt Readings from Last 3 Encounters:  11/09/21 145 lb (65.8 kg)  09/07/21 149 lb 9.6 oz (67.9 kg)  04/15/21 140 lb (63.5 kg)    Physical Exam Vitals and nursing note reviewed.  Constitutional:      General: She is not in acute distress.    Appearance: Normal appearance. She is normal weight. She is not ill-appearing, toxic-appearing or diaphoretic.  HENT:     Head: Normocephalic.     Right Ear: External ear normal.     Left Ear: External ear normal.     Nose: Nose normal.     Mouth/Throat:     Mouth: Mucous membranes are moist.     Pharynx: Oropharynx is clear.  Eyes:     General:        Right eye: No discharge.        Left eye: No discharge.     Extraocular Movements: Extraocular movements intact.     Conjunctiva/sclera: Conjunctivae normal.     Pupils: Pupils are equal, round, and reactive to light.   Cardiovascular:     Rate and Rhythm: Normal rate and regular rhythm.     Heart sounds: No murmur heard. Pulmonary:     Effort: Pulmonary effort is normal. No respiratory distress.     Breath sounds: Normal breath sounds. No wheezing or rales.  Musculoskeletal:     Cervical back: Normal range of motion and neck supple.  Skin:    General: Skin is warm and dry.     Capillary Refill: Capillary refill takes less than 2 seconds.  Neurological:     General: No focal deficit present.     Mental Status: She is alert and oriented to person, place, and time. Mental status is at baseline.  Psychiatric:        Mood and Affect: Mood normal.        Behavior: Behavior normal.        Thought Content: Thought content normal.        Judgment: Judgment normal.    Results for orders placed or performed in visit on 09/07/21  WET PREP FOR [place], YEAST, CLUE   Specimen: Sterile Swab   Sterile Swab  Result Value Ref Range   Trichomonas Exam Negative Negative   Yeast Exam Negative Negative   Clue Cell Exam Negative Negative  Urine Culture   Specimen: Urine   UR  Result Value Ref Range   Urine Culture, Routine Final report    Organism ID, Bacteria Comment   Microscopic Examination   Urine  Result Value Ref Range   WBC, UA 6-10 (A) 0 - 5 /hpf   RBC 0-2 0 - 2 /hpf   Epithelial Cells (non renal) 0-10 0 - 10 /hpf   Mucus, UA Present (A) Not Estab.   Bacteria, UA Many (A) None seen/Few  Urinalysis, Routine w reflex microscopic  Result Value Ref Range   Specific Gravity, UA 1.025 1.005 - 1.030   pH, UA 6.0 5.0 - 7.5   Color, UA Yellow Yellow   Appearance Ur Cloudy (A) Clear   Leukocytes,UA 2+ (A) Negative   Protein,UA Negative Negative/Trace   Glucose, UA Negative Negative   Ketones, UA Negative Negative   RBC, UA Trace (A) Negative   Bilirubin, UA Negative Negative   Urobilinogen, Ur 0.2 0.2 - 1.0 mg/dL   Nitrite,  UA Negative Negative   Microscopic Examination See below:        Assessment & Plan:   Problem List Items Addressed This Visit       Other   Anxiety    Chronic. Not well controlled. Patient see's a psychiatrist. Psychiatrist would like her hormone levels drawn due to having significant depression with menstrual cycles.  Will draw labs during visit.  Discussed likely outcome of labs with patient during visit. Will follow up after lab work returns.       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 Not well controlled. Patient see's a psychiatrist. Psychiatrist would like her hormone levels drawn due to having significant depression with menstrual cycles.  Will draw labs during visit.  Discussed likely outcome of labs with patient during visit. Will follow up after lab work returns.       Relevant Orders   FSH/LH   Estradiol   Progesterone   Testosterone   Comp Met (CMET)   Other Visit Diagnoses     Sleep walking       Sleep study ordered. Will make recommendations based on results.    Relevant Orders   Ambulatory referral to Sleep Studies   Comp Met (CMET)   Menorrhagia with regular cycle       Labs ordered to evaluate for anemia due to lengthy menstrual bleeding. Will make recommendations based on lab results.    Relevant Orders   Anemia Profile B   Urge incontinence       Information given for [facility]. If not improved can see pelvic floor therapist.        Follow up plan: Return if symptoms worsen or fail to improve.

## 2021-11-09 ENCOUNTER — Other Ambulatory Visit: Payer: Self-pay

## 2021-11-09 ENCOUNTER — Ambulatory Visit (INDEPENDENT_AMBULATORY_CARE_PROVIDER_SITE_OTHER): Payer: Managed Care, Other (non HMO) | Admitting: Nurse Practitioner

## 2021-11-09 ENCOUNTER — Encounter: Payer: Self-pay | Admitting: Nurse Practitioner

## 2021-11-09 VITALS — BP 109/76 | HR 89 | Temp 98.7°F | Ht 62.0 in | Wt 145.0 lb

## 2021-11-09 DIAGNOSIS — F513 Sleepwalking [somnambulism]: Secondary | ICD-10-CM | POA: Diagnosis not present

## 2021-11-09 DIAGNOSIS — F339 Major depressive disorder, recurrent, unspecified: Secondary | ICD-10-CM | POA: Diagnosis not present

## 2021-11-09 DIAGNOSIS — N920 Excessive and frequent menstruation with regular cycle: Secondary | ICD-10-CM

## 2021-11-09 DIAGNOSIS — F419 Anxiety disorder, unspecified: Secondary | ICD-10-CM | POA: Diagnosis not present

## 2021-11-09 DIAGNOSIS — N3941 Urge incontinence: Secondary | ICD-10-CM

## 2021-11-09 NOTE — Assessment & Plan Note (Signed)
Chronic. Not well controlled. Patient see's a psychiatrist. Psychiatrist would like her hormone levels drawn due to having significant depression with menstrual cycles.  Will draw labs during visit.  Discussed likely outcome of labs with patient during visit. Will follow up after lab work returns.

## 2021-11-10 LAB — FSH/LH
FSH: 4.9 m[IU]/mL
LH: 9.9 m[IU]/mL

## 2021-11-10 LAB — ANEMIA PROFILE B
Basophils Absolute: 0 10*3/uL (ref 0.0–0.2)
Basos: 1 %
EOS (ABSOLUTE): 0.2 10*3/uL (ref 0.0–0.4)
Eos: 5 %
Ferritin: 4 ng/mL — ABNORMAL LOW (ref 15–150)
Folate: 10.9 ng/mL (ref >3.0)
Hematocrit: 35.5 % (ref 34.0–46.6)
Hemoglobin: 11 g/dL — ABNORMAL LOW (ref 11.1–15.9)
Immature Grans (Abs): 0 10*3/uL (ref 0.0–0.1)
Immature Granulocytes: 0 %
Iron Saturation: 5 % — CL (ref 15–55)
Iron: 24 ug/dL — ABNORMAL LOW (ref 27–159)
Lymphocytes Absolute: 1.3 10*3/uL (ref 0.7–3.1)
Lymphs: 38 %
MCH: 23 pg — ABNORMAL LOW (ref 26.6–33.0)
MCHC: 31 g/dL — ABNORMAL LOW (ref 31.5–35.7)
MCV: 74 fL — ABNORMAL LOW (ref 79–97)
Monocytes Absolute: 0.3 10*3/uL (ref 0.1–0.9)
Monocytes: 10 %
Neutrophils Absolute: 1.6 10*3/uL (ref 1.4–7.0)
Neutrophils: 46 %
Platelets: 404 10*3/uL (ref 150–450)
RBC: 4.79 x10E6/uL (ref 3.77–5.28)
RDW: 15.8 % — ABNORMAL HIGH (ref 11.7–15.4)
Retic Ct Pct: 1.1 % (ref 0.6–2.6)
Total Iron Binding Capacity: 439 ug/dL (ref 250–450)
UIBC: 415 ug/dL (ref 131–425)
Vitamin B-12: 405 pg/mL (ref 232–1245)
WBC: 3.5 10*3/uL (ref 3.4–10.8)

## 2021-11-10 LAB — TESTOSTERONE: Testosterone: 15 ng/dL (ref 8–60)

## 2021-11-10 LAB — COMPREHENSIVE METABOLIC PANEL
ALT: 8 IU/L (ref 0–32)
AST: 17 IU/L (ref 0–40)
Albumin/Globulin Ratio: 1.7 (ref 1.2–2.2)
Albumin: 4.3 g/dL (ref 3.8–4.8)
Alkaline Phosphatase: 58 IU/L (ref 44–121)
BUN/Creatinine Ratio: 10 (ref 9–23)
BUN: 7 mg/dL (ref 6–20)
Bilirubin Total: 0.2 mg/dL (ref 0.0–1.2)
CO2: 24 mmol/L (ref 20–29)
Calcium: 9.3 mg/dL (ref 8.7–10.2)
Chloride: 102 mmol/L (ref 96–106)
Creatinine, Ser: 0.71 mg/dL (ref 0.57–1.00)
Globulin, Total: 2.6 g/dL (ref 1.5–4.5)
Glucose: 90 mg/dL (ref 70–99)
Potassium: 4.5 mmol/L (ref 3.5–5.2)
Sodium: 138 mmol/L (ref 134–144)
Total Protein: 6.9 g/dL (ref 6.0–8.5)
eGFR: 112 mL/min/{1.73_m2} (ref >59)

## 2021-11-10 LAB — ESTRADIOL: Estradiol: 59.8 pg/mL

## 2021-11-10 LAB — PROGESTERONE: Progesterone: 0.2 ng/mL

## 2021-11-10 NOTE — Progress Notes (Signed)
Please let patient know that her hormone labs do not reveal a reason for her symptoms. However, she does have iron deficiency anemia likely due to how much she is bleeding with her menstrual cycle.  I recommend she start ferrous sulfate 325mg  daily to help with this.  We will recheck this at future visits. Please let me know if she has any questions.

## 2021-11-14 ENCOUNTER — Encounter: Payer: Managed Care, Other (non HMO) | Admitting: Nurse Practitioner

## 2021-11-22 ENCOUNTER — Encounter: Payer: Managed Care, Other (non HMO) | Admitting: Nurse Practitioner

## 2022-03-26 ENCOUNTER — Encounter: Payer: Self-pay | Admitting: Nurse Practitioner

## 2022-03-26 DIAGNOSIS — R635 Abnormal weight gain: Secondary | ICD-10-CM

## 2022-04-13 ENCOUNTER — Telehealth: Payer: Self-pay

## 2022-04-13 NOTE — Telephone Encounter (Signed)
Left vm to confirm 04/20/22 appointment-Laura Morton

## 2022-04-20 ENCOUNTER — Ambulatory Visit: Payer: Managed Care, Other (non HMO) | Admitting: Nurse Practitioner

## 2022-04-20 ENCOUNTER — Encounter: Payer: Self-pay | Admitting: Nurse Practitioner

## 2022-04-20 VITALS — BP 124/86 | HR 82 | Temp 98.4°F | Resp 16 | Ht 62.0 in | Wt 148.6 lb

## 2022-04-20 DIAGNOSIS — Z7689 Persons encountering health services in other specified circumstances: Secondary | ICD-10-CM

## 2022-04-20 DIAGNOSIS — R635 Abnormal weight gain: Secondary | ICD-10-CM

## 2022-04-20 DIAGNOSIS — E559 Vitamin D deficiency, unspecified: Secondary | ICD-10-CM

## 2022-04-20 DIAGNOSIS — F419 Anxiety disorder, unspecified: Secondary | ICD-10-CM

## 2022-04-20 DIAGNOSIS — D508 Other iron deficiency anemias: Secondary | ICD-10-CM | POA: Diagnosis not present

## 2022-04-20 DIAGNOSIS — E538 Deficiency of other specified B group vitamins: Secondary | ICD-10-CM | POA: Diagnosis not present

## 2022-04-20 DIAGNOSIS — J4540 Moderate persistent asthma, uncomplicated: Secondary | ICD-10-CM

## 2022-04-20 DIAGNOSIS — R946 Abnormal results of thyroid function studies: Secondary | ICD-10-CM | POA: Diagnosis not present

## 2022-04-20 DIAGNOSIS — F339 Major depressive disorder, recurrent, unspecified: Secondary | ICD-10-CM

## 2022-04-20 DIAGNOSIS — E782 Mixed hyperlipidemia: Secondary | ICD-10-CM

## 2022-04-20 DIAGNOSIS — M25552 Pain in left hip: Secondary | ICD-10-CM

## 2022-04-20 DIAGNOSIS — J3089 Other allergic rhinitis: Secondary | ICD-10-CM

## 2022-04-20 MED ORDER — TRELEGY ELLIPTA 200-62.5-25 MCG/ACT IN AEPB: 1.0000 | INHALATION_SPRAY | Freq: Every day | RESPIRATORY_TRACT | 11 refills | Status: DC

## 2022-04-20 NOTE — Progress Notes (Signed)
[facility] [address] [place], [place] [address]  Internal MEDICINE  Office Visit Note  Patient Name: Laura Morton  08/12/1986  [id number]  Date of Service: 04/20/2022   Complaints/HPI Pt is here for establishment of PCP. Chief Complaint  Patient presents with   New Patient (Initial Visit)   Asthma   Groin Pain    Started about 3 months ago - on left side   Weight Gain    Took steroids and feels weight gain has been a side affect - took prednisone last June, has gained weight since taking it   HPI Laura Morton presents for a new patient visit to establish care.  She is a well-appearing 35-year-old female with recent iron deficiency anemia and prior abnormal thyroid labs several years ago and a history of asthma which has bothered her enough to use her rescue inhaler 1-2 times daily.  He has no other significant medical problems at this time.  Her other main concerns for today is weight gain that has been an issue since taking prednisone last year in June and groin pain that started a few months ago on the left side.  She has also had a history of depression and sees Dr. Kathleen Sisk. Was previously taking lexapro but her psychiatrist switched her to cymbalta.  She reports having fatigue, low energy denies any significant hair loss but has very long hair so is not sure if the hair loss is normal or too much.she has been gaining weight even though she is working out every day for several months.  Has been having left hip pain when sitting on the floor, wants to have an xray done.  Anemia noted on previous labs Due for repeat labs, due for routine pap smear and annual physical exam.     Current Medication: Outpatient Encounter Medications as of 04/20/2022  Medication Sig   albuterol (VENTOLIN HFA) 108 (90 Base) MCG/ACT inhaler Inhale 2 puffs into the lungs every 6 (six) hours as needed for wheezing or shortness of breath.   busPIRone (BUSPAR) 10 MG tablet Take 20 mg by mouth 3  (three) times daily.   DULoxetine (CYMBALTA) 20 MG capsule Take 40 mg by mouth daily. Take once daily.   Fluticasone-Umeclidin-Vilant (TRELEGY ELLIPTA) 200-62.5-25 MCG/ACT AEPB Inhale 1 puff into the lungs daily at 12 noon.   hydrOXYzine (ATARAX/VISTARIL) 25 MG tablet Take 1 tablet (25 mg total) by mouth 3 (three) times daily as needed.   Spacer/Aero-Holding Chambers (AEROCHAMBER PLUS) inhaler Use as instructed   [DISCONTINUED] nystatin cream (MYCOSTATIN) Apply 1 application topically 2 (two) times daily.   No facility-administered encounter medications on file as of 04/20/2022.    Surgical History: History reviewed. No pertinent surgical history.  Medical History: Past Medical History:  Diagnosis Date   Asthma    Environmental allergies    Knee fracture, left    Vertigo     Family History: Family History  Problem Relation Age of Onset   Diabetes Mother    Lupus Mother    Hypertension Mother    Hypertension Father    Heart disease Sister    Spina bifida Sister     Social History   Socioeconomic History   Marital status: Significant Other    Spouse name: Not on file   Number of children: Not on file   Years of education: Not on file   Highest education level: Not on file  Occupational History   Not on file  Tobacco Use   Smoking   status: Never   Smokeless tobacco: Never  Vaping Use   Vaping Use: Never used  Substance and Sexual Activity   Alcohol use: Yes    Comment: on occasion   Drug use: No   Sexual activity: Yes    Partners: Male    Birth control/protection: Pill  Other Topics Concern   Not on file  Social History Narrative   Not on file   Social Determinants of Health   Financial Resource Strain: Not on file  Food Insecurity: Not on file  Transportation Needs: Not on file  Physical Activity: Not on file  Stress: Not on file  Social Connections: Not on file  Intimate Partner Violence: Not on file     Review of Systems  Constitutional:  Positive  for fatigue. Negative for chills and unexpected weight change.  HENT:  Negative for congestion, postnasal drip, rhinorrhea, sneezing and sore throat.   Eyes:  Negative for redness.  Respiratory:  Negative for cough, chest tightness, shortness of breath and wheezing.   Cardiovascular:  Negative for chest pain and palpitations.  Gastrointestinal:  Negative for abdominal pain, constipation, diarrhea, nausea and vomiting.  Genitourinary:  Negative for dysuria and frequency.  Musculoskeletal:  Positive for arthralgias (left hip pain) and myalgias. Negative for back pain, joint swelling and neck pain.  Skin:  Negative for rash.  Neurological: Negative.  Negative for tremors and numbness.  Hematological:  Negative for adenopathy. Does not bruise/bleed easily.  Psychiatric/Behavioral:  Negative for behavioral problems (Depression), sleep disturbance and suicidal ideas. The patient is not nervous/anxious.     Vital Signs: BP 124/86   Pulse 82   Temp 98.4 F (36.9 C)   Resp 16   Ht 5' 2" (1.575 m)   Wt 148 lb 9.6 oz (67.4 kg)   LMP 04/12/2022 (Exact Date)   SpO2 99%   BMI 27.18 kg/m    Physical Exam Vitals reviewed.  Constitutional:      General: She is not in acute distress.    Appearance: Normal appearance. She is normal weight. She is not ill-appearing.  HENT:     Head: Normocephalic and atraumatic.  Eyes:     Pupils: Pupils are equal, round, and reactive to light.  Cardiovascular:     Rate and Rhythm: Normal rate and regular rhythm.  Pulmonary:     Effort: Pulmonary effort is normal. No respiratory distress.  Neurological:     Mental Status: She is alert and oriented to person, place, and time.  Psychiatric:        Mood and Affect: Mood normal.        Behavior: Behavior normal.       Assessment/Plan: 1. Other iron deficiency anemia Routine labs ordered, IDA noted on previous labs.  - Iron, TIBC and Ferritin Panel - CBC with Differential/Platelet - B12 and Folate  Panel - CMP14+EGFR  2. B12 deficiency Routine labs ordered.  - Iron, TIBC and Ferritin Panel - CBC with Differential/Platelet - B12 and Folate Panel - CMP14+EGFR  3. Abnormal thyroid function test Routine labs ordered.  - TSH + free T4 - CMP14+EGFR  4. Abnormal weight gain Routine labs ordered.  - TSH + free T4 - CMP14+EGFR  5. Moderate persistent asthma without complication Trelegy ordered. Labs ordered, reevaluate effectiveness of new inhaler.  - CMP14+EGFR - Fluticasone-Umeclidin-Vilant (TRELEGY ELLIPTA) 200-62.5-25 MCG/ACT AEPB; Inhale 1 puff into the lungs daily at 12 noon.  Dispense: 60 each; Refill: 11  6. Vitamin D deficiency Routine labs ordered.  -  Vitamin D (25 hydroxy) - CMP14+EGFR  7. Mixed hyperlipidemia Routine labs ordered.  - CMP14+EGFR - Lipid Profile  8. Hip pain, acute, left Autoimmune labs ordered, other routine labs ordered, left hip xray ordered.  - CMP14+EGFR - DG Hip Unilat W OR W/O Pelvis 2-3 Views Left; Future - Sed Rate (ESR) - C-reactive protein - ANA Direct w/Reflex if Positive - Rheumatoid Factor  9. Non-seasonal allergic rhinitis due to other allergic trigger Has history of allergic rhinitis, uses OTC medications if needed.   10. Encounter to establish care with new doctor Suboptimal patient-provider relationship with previous PCP, patient is establishing care for continued medical care and preventive care.   11. Depression, recurrent (HCC) Managed by Dr. Sisk - DULoxetine (CYMBALTA) 20 MG capsule; Take 40 mg by mouth daily. Take once daily.  12. Anxiety Managed by Dr. Sisk    General Counseling: Laura Morton verbalizes understanding of the findings of todays visit and agrees with plan of treatment. I have discussed any further diagnostic evaluation that may be needed or ordered today. We also reviewed her medications today. she has been encouraged to call the office with any questions or concerns that should arise related to todays  visit.    Counseling:  [place] Controlled Substance Database was reviewed by me.  Orders Placed This Encounter  Procedures   DG Hip Unilat W OR W/O Pelvis 2-3 Views Left   Iron, TIBC and Ferritin Panel   CBC with Differential/Platelet   B12 and Folate Panel   Vitamin D (25 hydroxy)   TSH + free T4   CMP14+EGFR   Lipid Profile   Sed Rate (ESR)   C-reactive protein   ANA Direct w/Reflex if Positive   Rheumatoid Factor    Meds ordered this encounter  Medications   Fluticasone-Umeclidin-Vilant (TRELEGY ELLIPTA) 200-62.5-25 MCG/ACT AEPB    Sig: Inhale 1 puff into the lungs daily at 12 noon.    Dispense:  60 each    Refill:  11    May need PA, please let us know, we will complete it.    Return for CPE/PAP, Alyssa PCP, Review labs/test need doc note for today. .  Time spent:30 Minutes Time spent with patient included reviewing progress notes, labs, imaging studies, and discussing plan for follow up.   [place] Controlled Substance Database was reviewed by me for overdose risk score (ORS)   This patient was seen by Alyssa Abernathy, FNP-C in collaboration with Dr. Fozia Khan as a part of collaborative care agreement.    Alyssa R. Abernathy, MSN, FNP-C Internal Medicine 

## 2022-04-21 ENCOUNTER — Ambulatory Visit
Admission: RE | Admit: 2022-04-21 | Discharge: 2022-04-21 | Disposition: A | Discharge status: Home / Self Care | Payer: Managed Care, Other (non HMO) | Attending: Nurse Practitioner | Admitting: Nurse Practitioner

## 2022-04-21 ENCOUNTER — Ambulatory Visit
Admission: RE | Admit: 2022-04-21 | Discharge: 2022-04-21 | Disposition: A | Discharge status: Home / Self Care | Payer: Managed Care, Other (non HMO) | Source: Ambulatory Visit | Attending: Nurse Practitioner | Admitting: Nurse Practitioner

## 2022-04-21 ENCOUNTER — Other Ambulatory Visit
Admission: RE | Admit: 2022-04-21 | Discharge: 2022-04-21 | Disposition: A | Discharge status: Home / Self Care | Payer: Managed Care, Other (non HMO) | Source: Home / Self Care | Attending: Nurse Practitioner | Admitting: Nurse Practitioner

## 2022-04-21 DIAGNOSIS — M25552 Pain in left hip: Secondary | ICD-10-CM

## 2022-04-21 DIAGNOSIS — E559 Vitamin D deficiency, unspecified: Secondary | ICD-10-CM | POA: Insufficient documentation

## 2022-04-21 LAB — CBC WITH DIFFERENTIAL/PLATELET
Abs Immature Granulocytes: 0.02 10*3/uL (ref 0.00–0.07)
Basophils Absolute: 0 10*3/uL (ref 0.0–0.1)
Basophils Relative: 1 %
Eosinophils Absolute: 0.1 10*3/uL (ref 0.0–0.5)
Eosinophils Relative: 3 %
HCT: 37.5 % (ref 36.0–46.0)
Hemoglobin: 11.1 g/dL — ABNORMAL LOW (ref 12.0–15.0)
Immature Granulocytes: 0 %
Lymphocytes Relative: 25 %
Lymphs Abs: 1.3 10*3/uL (ref 0.7–4.0)
MCH: 21.6 pg — ABNORMAL LOW (ref 26.0–34.0)
MCHC: 29.6 g/dL — ABNORMAL LOW (ref 30.0–36.0)
MCV: 73 fL — ABNORMAL LOW (ref 80.0–100.0)
Monocytes Absolute: 0.4 10*3/uL (ref 0.1–1.0)
Monocytes Relative: 7 %
Neutro Abs: 3.5 10*3/uL (ref 1.7–7.7)
Neutrophils Relative %: 64 %
Platelets: 422 10*3/uL — ABNORMAL HIGH (ref 150–400)
RBC: 5.14 MIL/uL — ABNORMAL HIGH (ref 3.87–5.11)
RDW: 17.2 % — ABNORMAL HIGH (ref 11.5–15.5)
WBC: 5.4 10*3/uL (ref 4.0–10.5)
nRBC: 0 % (ref 0.0–0.2)

## 2022-04-21 LAB — COMPREHENSIVE METABOLIC PANEL
ALT: 12 U/L (ref 0–44)
AST: 18 U/L (ref 15–41)
Albumin: 4.5 g/dL (ref 3.5–5.0)
Alkaline Phosphatase: 44 U/L (ref 38–126)
Anion gap: 6 (ref 5–15)
BUN: 11 mg/dL (ref 6–20)
CO2: 26 mmol/L (ref 22–32)
Calcium: 9.2 mg/dL (ref 8.9–10.3)
Chloride: 105 mmol/L (ref 98–111)
Creatinine, Ser: 0.63 mg/dL (ref 0.44–1.00)
GFR, Estimated: >60 mL/min (ref >60)
Glucose, Bld: 89 mg/dL (ref 70–99)
Potassium: 4.2 mmol/L (ref 3.5–5.1)
Sodium: 137 mmol/L (ref 135–145)
Total Bilirubin: 0.3 mg/dL (ref 0.3–1.2)
Total Protein: 8.2 g/dL — ABNORMAL HIGH (ref 6.5–8.1)

## 2022-04-21 LAB — LIPID PANEL
Cholesterol: 229 mg/dL — ABNORMAL HIGH (ref 0–200)
HDL: 112 mg/dL (ref >40)
LDL Cholesterol: 103 mg/dL — ABNORMAL HIGH (ref 0–99)
Total CHOL/HDL Ratio: 2 RATIO
Triglycerides: 69 mg/dL (ref <150)
VLDL: 14 mg/dL (ref 0–40)

## 2022-04-21 LAB — VITAMIN B12: Vitamin B-12: 421 pg/mL (ref 180–914)

## 2022-04-21 LAB — IRON AND TIBC
Iron: 19 ug/dL — ABNORMAL LOW (ref 28–170)
Saturation Ratios: 3 % — ABNORMAL LOW (ref 10.4–31.8)
TIBC: 556 ug/dL — ABNORMAL HIGH (ref 250–450)
UIBC: 537 ug/dL

## 2022-04-21 LAB — C-REACTIVE PROTEIN: CRP: 0.6 mg/dL (ref <1.0)

## 2022-04-21 LAB — T4, FREE: Free T4: 0.83 ng/dL (ref 0.61–1.12)

## 2022-04-21 LAB — VITAMIN D 25 HYDROXY (VIT D DEFICIENCY, FRACTURES): Vit D, 25-Hydroxy: 26.08 ng/mL — ABNORMAL LOW (ref 30–100)

## 2022-04-21 LAB — TSH: TSH: 1.525 u[IU]/mL (ref 0.350–4.500)

## 2022-04-21 LAB — FOLATE: Folate: 16.5 ng/mL (ref >5.9)

## 2022-04-21 LAB — SEDIMENTATION RATE: Sed Rate: 9 mm/hr (ref 0–20)

## 2022-04-21 LAB — FERRITIN: Ferritin: 2 ng/mL — ABNORMAL LOW (ref 11–307)

## 2022-04-22 LAB — ANA W/REFLEX IF POSITIVE: Anti Nuclear Antibody (ANA): NEGATIVE

## 2022-04-22 LAB — RHEUMATOID FACTOR: Rheumatoid fact SerPl-aCnc: 10.8 IU/mL (ref <14.0)

## 2022-05-15 ENCOUNTER — Telehealth: Payer: Self-pay

## 2022-05-15 NOTE — Telephone Encounter (Signed)
Left vm and sent mychart message to confirm 05/23/22 appointment-Toni

## 2022-05-23 ENCOUNTER — Ambulatory Visit (INDEPENDENT_AMBULATORY_CARE_PROVIDER_SITE_OTHER): Payer: Managed Care, Other (non HMO) | Admitting: Internal Medicine

## 2022-05-23 ENCOUNTER — Encounter: Payer: Self-pay | Admitting: Nurse Practitioner

## 2022-05-23 VITALS — BP 110/80 | HR 87 | Temp 98.5°F | Resp 16 | Ht 62.0 in | Wt 145.0 lb

## 2022-05-23 DIAGNOSIS — Z124 Encounter for screening for malignant neoplasm of cervix: Secondary | ICD-10-CM

## 2022-05-23 DIAGNOSIS — Z1231 Encounter for screening mammogram for malignant neoplasm of breast: Secondary | ICD-10-CM

## 2022-05-23 DIAGNOSIS — N6011 Diffuse cystic mastopathy of right breast: Secondary | ICD-10-CM

## 2022-05-23 DIAGNOSIS — Z113 Encounter for screening for infections with a predominantly sexual mode of transmission: Secondary | ICD-10-CM | POA: Diagnosis not present

## 2022-05-23 DIAGNOSIS — Z0001 Encounter for general adult medical examination with abnormal findings: Secondary | ICD-10-CM | POA: Diagnosis not present

## 2022-05-23 DIAGNOSIS — E782 Mixed hyperlipidemia: Secondary | ICD-10-CM

## 2022-05-23 DIAGNOSIS — J4540 Moderate persistent asthma, uncomplicated: Secondary | ICD-10-CM

## 2022-05-23 DIAGNOSIS — D508 Other iron deficiency anemias: Secondary | ICD-10-CM

## 2022-05-23 DIAGNOSIS — Z01419 Encounter for gynecological examination (general) (routine) without abnormal findings: Secondary | ICD-10-CM | POA: Diagnosis not present

## 2022-05-23 DIAGNOSIS — R300 Dysuria: Secondary | ICD-10-CM

## 2022-05-23 NOTE — Progress Notes (Signed)
[facility] [place], [place] [address]  Internal MEDICINE  Office Visit Note  Patient Name: Laura Morton  [id number]  [id number]  Date of Service: 05/23/2022  Chief Complaint  Patient presents with   Annual Exam    Left hip pain, unable to sleep on left side, aches, will pop when doing exercise, discuss iron   Asthma     HPI Pt is here for routine health maintenance examination 1. Patient has history of asthma and takes Trelegy, seems to be under control she takes albuterol as needed. 2.  Complains of left hip pain especially after exercise, denies any trauma. 3.  On recent labs patient is anemic with a ferritin level of 2 oh she did start taking some iron supplements over-the-counter. 4.  She is sexually active does not use Any contraception as partner has vasectomy done and does not wish to have any kids 5.  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   busPIRone (BUSPAR) 10 MG tablet Take 20 mg by mouth 3 (three) times daily.   DULoxetine (CYMBALTA) 20 MG capsule Take 40 mg by mouth daily. Take once daily.   hydrOXYzine (ATARAX/VISTARIL) 25 MG tablet Take 1 tablet (25 mg total) by mouth 3 (three) times daily as needed.   Spacer/Aero-Holding Chambers (AEROCHAMBER PLUS) inhaler Use as instructed   Tdap (BOOSTRIX) 5-2.5-18.5 LF-MCG/0.5 injection Inject 0.5 mLs into the muscle once.   [DISCONTINUED] Fluticasone-Umeclidin-Vilant (TRELEGY ELLIPTA) 200-62.5-25 MCG/ACT AEPB Inhale 1 puff into the lungs daily at 12 noon.   No facility-administered encounter medications on file as of 05/23/2022.    Surgical History: History reviewed. No pertinent surgical history.  Medical History: Past Medical  History:  Diagnosis Date   Asthma    Environmental allergies    Knee fracture, left    Vertigo     Family History: Family History  Problem Relation Age of Onset   Diabetes Mother    Lupus Mother    Hypertension Mother    Hypertension Father    Heart disease Sister    Spina bifida Sister     Social History: Social History   Socioeconomic History   Marital status: Significant Other    Spouse name: Not on file   Number of children: Not on file   Years of education: Not on file   Highest education level: Not on file  Occupational History   Not on file  Tobacco Use   Smoking status: Never   Smokeless tobacco: Never  Vaping Use   Vaping Use: Never used  Substance and Sexual Activity   Alcohol use: Yes    Comment: on occasion   Drug use: No   Sexual activity: Yes    Partners: Male    Birth control/protection: Pill  Other Topics Concern   Not on file  Social History Narrative   Not on file   Social Determinants of Health   Financial Resource Strain: Not on file  Food Insecurity: Not on file  Transportation Needs: Not on file  Physical Activity: Not on file  Stress: Not on file  Social Connections: Not on file      Review of Systems  Constitutional:  Negative for chills, fatigue and unexpected weight change.  HENT:  Negative for congestion, rhinorrhea, sneezing and sore throat.   Eyes:  Negative for redness.  Respiratory:  Negative for cough, chest tightness and shortness of breath.   Cardiovascular:  Negative for chest pain and palpitations.  Gastrointestinal:  Negative for abdominal pain, constipation, diarrhea, nausea and vomiting.  Genitourinary:  Negative for dysuria and frequency.  Musculoskeletal:  Negative for arthralgias, back pain, joint swelling and neck pain.       Left pain after exercise  Skin:  Negative for rash.  Neurological: Negative.  Negative for tremors and numbness.  Hematological:  Negative for adenopathy. Does not bruise/bleed  easily.  Psychiatric/Behavioral:  Negative for behavioral problems (Depression), sleep disturbance and suicidal ideas. The patient is not nervous/anxious.      Vital Signs: BP 110/80   Pulse 87   Temp 98.5 F (36.9 C)   Resp 16   Ht '5\' 2"'$  (1.575 m)   Wt 145 lb (65.8 kg)   SpO2 98%   BMI 26.52 kg/m    Physical Exam Constitutional:      General: She is not in acute distress.    Appearance: She is well-developed. She is not diaphoretic.  HENT:     Head: Normocephalic and atraumatic.     Right Ear: External ear normal.     Left Ear: External ear normal.     Nose: Nose normal.     Mouth/Throat:     Pharynx: No oropharyngeal exudate.  Eyes:     General: No scleral icterus.       Right eye: No discharge.        Left eye: No discharge.     Conjunctiva/sclera: Conjunctivae normal.     Pupils: Pupils are equal, round, and reactive to light.  Neck:     Thyroid: No thyromegaly.     Vascular: No JVD.     Trachea: No tracheal deviation.  Cardiovascular:     Rate and Rhythm: Normal rate and regular rhythm.     Heart sounds: Normal heart sounds. No murmur heard.    No friction rub. No gallop.  Pulmonary:     Effort: Pulmonary effort is normal. No respiratory distress.     Breath sounds: Normal breath sounds. No stridor. No wheezing or rales.  Chest:     Chest wall: No tenderness.  Breasts:    Right: Normal.     Left: Normal.     Comments: Bilateral cyst present mobile Abdominal:     General: Bowel sounds are normal. There is no distension.     Palpations: Abdomen is soft. There is no mass.     Tenderness: There is no abdominal tenderness. There is no guarding or rebound.  Musculoskeletal:        General: No tenderness or deformity. Normal range of motion.     Cervical back: Normal range of motion and neck supple.  Lymphadenopathy:     Cervical: No cervical adenopathy.  Skin:    General: Skin is warm and dry.     Coloration: Skin is not pale.     Findings: No erythema  or rash.  Neurological:     Mental Status: She is alert.     Cranial Nerves: No cranial nerve deficit.     Motor: No abnormal muscle tone.     Coordination: Coordination normal.     Deep Tendon Reflexes: Reflexes are normal and symmetric.  Psychiatric:        Behavior: Behavior normal.  Thought Content: Thought content normal.        Judgment: Judgment normal.      LABS: Recent Results (from the past 2160 hour(s))  Sedimentation rate     Status: None   Collection Time: 04/21/22  2:14 PM  Result Value Ref Range   Sed Rate 9 0 - 20 mm/hr    Comment: Performed at [facility], [address] [place], [place] [address]  C-reactive protein     Status: None   Collection Time: 04/21/22  2:14 PM  Result Value Ref Range   CRP 0.6 <1.0 mg/dL    Comment: Performed at [facility] [address], [place], [place] [address]  ANA w/Reflex if Positive     Status: None   Collection Time: 04/21/22  2:14 PM  Result Value Ref Range   Anti Nuclear Antibody (ANA) Negative Negative    Comment: (NOTE) Performed At: [facility] [place], [place] [id number] Rush Farmer MD [id number]   Rheumatoid factor     Status: None   Collection Time: 04/21/22  2:14 PM  Result Value Ref Range   Rhuematoid fact SerPl-aCnc 10.8 <14.0 IU/mL    Comment: (NOTE) Performed At: [facility] [place], [place] [id number] Rush Farmer MD [id number]   Iron and TIBC     Status: Abnormal   Collection Time: 04/21/22  2:14 PM  Result Value Ref Range   Iron 19 (L) 28 - 170 ug/dL   TIBC 556 (H) 250 - 450 ug/dL   Saturation Ratios 3 (L) 10.4 - 31.8 %   UIBC 537 ug/dL    Comment: Performed at [facility], [address] [place], [place] [address]  Ferritin     Status: Abnormal   Collection Time: 04/21/22  2:14 PM  Result Value Ref Range   Ferritin 2 (L) 11 - 307 ng/mL    Comment: Performed at [facility], [address] [place], [place] [address]  CBC with Differential/Platelet     Status: Abnormal   Collection Time: 04/21/22  2:14 PM  Result Value Ref Range   WBC 5.4 4.0 - 10.5 K/uL   RBC 5.14 (H) 3.87 - 5.11 MIL/uL   Hemoglobin 11.1 (L) 12.0 - 15.0 g/dL   HCT 37.5 36.0 - 46.0 %   MCV 73.0 (L) 80.0 - 100.0 fL   MCH 21.6 (L) 26.0 - 34.0 pg   MCHC 29.6 (L) 30.0 - 36.0 g/dL   RDW 17.2 (H) 11.5 - 15.5 %   Platelets 422 (H) 150 - 400 K/uL   nRBC 0.0 0.0 - 0.2 %   Neutrophils Relative % 64 %   Neutro Abs 3.5 1.7 - 7.7 K/uL   Lymphocytes Relative 25 %   Lymphs Abs 1.3 0.7 - 4.0 K/uL   Monocytes Relative 7 %   Monocytes Absolute 0.4 0.1 - 1.0 K/uL   Eosinophils Relative 3 %   Eosinophils Absolute 0.1 0.0 - 0.5 K/uL   Basophils Relative 1 %   Basophils Absolute 0.0 0.0 - 0.1 K/uL   Immature Granulocytes 0 %   Abs Immature Granulocytes 0.02 0.00 - 0.07 K/uL    Comment: Performed at [facility], [address] [place], [place] [address]  Vitamin B12     Status: None   Collection Time: 04/21/22  2:14 PM  Result Value Ref Range   Vitamin B-12 421 180 - 914 pg/mL    Comment: (NOTE) This assay is  not validated for testing neonatal or myeloproliferative syndrome specimens for Vitamin B12 levels. Performed at [facility], [place] [address] [place], [place] [address]   Folate     Status: None   Collection Time: 04/21/22  2:14 PM  Result Value Ref Range   Folate 16.5 >5.9 ng/mL    Comment: Performed at [facility], [address] [place], [place] [address]  VITAMIN D 25 Hydroxy (Vit-D Deficiency, Fractures)     Status: Abnormal   Collection Time: 04/21/22  2:14 PM  Result Value Ref Range   Vit D, 25-Hydroxy 26.08 (L) 30 - 100 ng/mL    Comment: (NOTE) Vitamin D deficiency has been defined by the Institute of Medicine  and an Endocrine Society practice guideline as a level of serum 25-OH  vitamin D less than 20 ng/mL (1,2). The Endocrine Society went on to  further  define vitamin D insufficiency as a level between 21 and 29  ng/mL (2).  1. IOM (Institute of Medicine). 2010. Dietary reference intakes for  calcium and D. Levy: The [employer]. 2. Holick MF, Binkley Donnellson, Bischoff-Ferrari HA, et al. Evaluation,  treatment, and prevention of vitamin D deficiency: an Endocrine  Society clinical practice guideline, JCEM. 2011 Jul; 96(7): 1911-30.  Performed at [facility], [place] [address] [place], [place] [address]   TSH     Status: None   Collection Time: 04/21/22  2:14 PM  Result Value Ref Range   TSH 1.525 0.350 - 4.500 uIU/mL    Comment: Performed by a 3rd Generation assay with a functional sensitivity of <=0.01 uIU/mL. Performed at [facility], [address] [place], [place] [address]   T4, free     Status: None   Collection Time: 04/21/22  2:14 PM  Result Value Ref Range   Free T4 0.83 0.61 - 1.12 ng/dL    Comment: (NOTE) Biotin ingestion may interfere with free T4 tests. If the results are inconsistent with the TSH level, previous test results, or the clinical presentation, then consider biotin interference. If needed, order repeat testing after stopping biotin. Performed at [facility], [address] [place], [place] [address]   Comprehensive metabolic panel     Status: Abnormal   Collection Time: 04/21/22  2:14 PM  Result Value Ref Range   Sodium 137 135 - 145 mmol/L   Potassium 4.2 3.5 - 5.1 mmol/L   Chloride 105 98 - 111 mmol/L   CO2 26 22 - 32 mmol/L   Glucose, Bld 89 70 - 99 mg/dL    Comment: Glucose reference range applies only to samples taken after fasting for at least 8 hours.   BUN 11 6 - 20 mg/dL   Creatinine, Ser 0.63 0.44 - 1.00 mg/dL   Calcium 9.2 8.9 - 10.3 mg/dL   Total Protein 8.2 (H) 6.5 - 8.1 g/dL   Albumin 4.5 3.5 - 5.0 g/dL   AST 18 15 - 41 U/L   ALT 12 0 - 44 U/L   Alkaline Phosphatase 44 38 - 126 U/L   Total Bilirubin 0.3 0.3 - 1.2 mg/dL   GFR,  Estimated >60 >60 mL/min    Comment: (NOTE) Calculated using the CKD-EPI Creatinine Equation (2021)    Anion gap 6 5 - 15    Comment: Performed at [facility], [address] [place], [place] [address]  Lipid panel     Status: Abnormal   Collection Time: 04/21/22  2:14 PM  Result Value Ref Range   Cholesterol 229 (H) 0 - 200 mg/dL   Triglycerides 69 <150 mg/dL   HDL 112 >40 mg/dL   Total CHOL/HDL Ratio 2.0 RATIO   VLDL 14 0 - 40 mg/dL   LDL Cholesterol 103 (H) 0 - 99 mg/dL    Comment:        Total Cholesterol/HDL:CHD Risk Coronary Heart Disease Risk Table                     Men   Women  1/2 Average Risk   3.4   3.3  Average Risk       5.0   4.4  2 X Average Risk   9.6   7.1  3 X Average Risk  23.4   11.0        Use the calculated Patient Ratio above and the CHD Risk Table to determine the patient's CHD Risk.        ATP III CLASSIFICATION (LDL):  <100     mg/dL   Optimal  100-129  mg/dL   Near or Above                    Optimal  130-159  mg/dL   Borderline  160-189  mg/dL   High  >190     mg/dL   Very High Performed at [facility], [address] [place], [place] [address]        Assessment/Plan: 1. Encounter for general adult medical examination with abnormal findings All preventive health maintenance is updated, will do left hip pain patient instructed to stretch before and after exercise we will get x-rays if needed on next visit  2. Routine cervical smear Pap smear is performed no abnormalities noted - IGP, Aptima HPV  3. Routine screening for STI (sexually transmitted infection) - NuSwab Vaginitis Plus (VG+)  4. Visit for screening mammogram - MM DIGITAL SCREENING BILATERAL; Future  5. Fibrocystic disease of right breast Does have cyst on the right and left breast, will get screening mammogram  6. Other iron deficiency anemia Patient is to continue take her iron as she is deficient in it, will recheck her levels in 3 months if  no improvement seen she will need to get iron infusion  7. Mixed hyperlipidemia Patient will adjust her diet mildly elevated cholesterol  8. Moderate persistent asthma without complication We will stop Trelegy as no need for triple therapy and give her samples of Breo and Anoro both and see which one helps her the most  9. Dysuria - UA/M w/rflx Culture, Routine   General Counseling: cherell colvin understanding of the findings of todays visit and agrees with plan of treatment. I have discussed any further diagnostic evaluation that may be needed or ordered today. We also reviewed her medications today. she has been encouraged to call the office with any questions or concerns that should arise related to todays visit.    Counseling:   Controlled Substance Database was reviewed by me.  Orders Placed This Encounter  Procedures   MM DIGITAL SCREENING BILATERAL   UA/M w/rflx Culture, Routine   NuSwab Vaginitis Plus (VG+)      Total time spent:35 Minutes  Time spent includes review of chart, medications, test results, and follow up plan with the patient.     Lavera Guise, MD  Internal Medicine

## 2022-05-24 NOTE — Progress Notes (Signed)
Just FYI, urine sample is not clean catch

## 2022-05-25 ENCOUNTER — Telehealth: Payer: Self-pay

## 2022-05-25 LAB — UA/M W/RFLX CULTURE, ROUTINE
Bilirubin, UA: NEGATIVE
Glucose, UA: NEGATIVE
Nitrite, UA: NEGATIVE
RBC, UA: NEGATIVE
Specific Gravity, UA: 1.025 (ref 1.005–1.030)
Urobilinogen, Ur: 0.2 mg/dL (ref 0.2–1.0)
pH, UA: 6.5 (ref 5.0–7.5)

## 2022-05-25 LAB — NUSWAB VAGINITIS PLUS (VG+)
Atopobium vaginae: HIGH Score — AB
Candida albicans, NAA: NEGATIVE
Candida glabrata, NAA: NEGATIVE
Chlamydia trachomatis, NAA: NEGATIVE
Megasphaera 1: HIGH Score — AB
Neisseria gonorrhoeae, NAA: NEGATIVE
Trich vag by NAA: NEGATIVE

## 2022-05-25 LAB — MICROSCOPIC EXAMINATION
Casts: NONE SEEN /lpf
Epithelial Cells (non renal): >10 /hpf — AB (ref 0–10)

## 2022-05-25 LAB — URINE CULTURE, REFLEX

## 2022-05-25 NOTE — Telephone Encounter (Signed)
Called  t and went over what medications patient was taking and updated medication list.  Pt will call back and let us know what inhaler works the best.  Jearl Klinefelter or BREO samples given at [place] 05/23/22

## 2022-05-26 ENCOUNTER — Telehealth: Payer: Self-pay

## 2022-05-26 NOTE — Telephone Encounter (Signed)
Message sent to [facility] regarding pt returning her call

## 2022-05-28 LAB — IGP, APTIMA HPV: HPV Aptima: NEGATIVE

## 2022-05-30 ENCOUNTER — Encounter: Payer: Self-pay | Admitting: Internal Medicine

## 2022-06-12 ENCOUNTER — Encounter: Payer: Managed Care, Other (non HMO) | Attending: Nurse Practitioner | Admitting: Dietician

## 2022-06-12 VITALS — Ht 62.0 in | Wt 146.9 lb

## 2022-06-12 DIAGNOSIS — D509 Iron deficiency anemia, unspecified: Secondary | ICD-10-CM

## 2022-06-12 DIAGNOSIS — R635 Abnormal weight gain: Secondary | ICD-10-CM | POA: Insufficient documentation

## 2022-06-12 NOTE — Patient Instructions (Addendum)
Great job making healthy food choices, keep it up! AIm for about 70g protein daily (from all foods), at least 40-50g from high protein sources Continue with regular exercise

## 2022-06-12 NOTE — Progress Notes (Signed)
Medical Nutrition Therapy: Visit start time: 0945  end time: 4656  Assessment:   Referral Diagnosis: abnormal weight gain Other medical history/ diagnoses: asthma, iron deficiency anemia Psychosocial issues/ stress concerns: anxiety, depression  Medications, supplements: reconciled list in medical record   Preferred learning method:  Hands-on    Current weight: 146.9lbs Height: 5'2" BMI: 26.87 Patient's personal weight goal: 115-120 lbs  Progress and evaluation:  Patient report she gained weight after steroid therapy about 10 years ago, lost after treatment with medication for overactive thyroid; she also stopped drinking sodas, exercised at gym 1 hour most days. Regained more recently after taking birth control and another short term treatment of prednisone. She has been increasing physical activity but has not noticed any significant change in weight.   Iron deficiency due to heavy menstrual bleeding; 04/21/22 iron at 19ug (24 10/2021); ferritin was at 2ng/ml, and at 4 10/2021. She is taking an iron supplement. Food allergies: lactose intolerance; is able to eat cheese Special diet practices: follow vegetarian eating pattern  Patient seeks help with overcoming difficulty with weight loss, improving iron status Next PCP appt is 2024   Dietary Intake:  Usual eating pattern includes 3 meals and 2-3 snacks per day. Dining out frequency: 2-3 meals per week. Who plans meals/ buys groceries? self Who prepares meals? Self, significant other  Breakfast: coffee first, protein shake with remaining coffee adds almond milk, date, protein powder Snack:  Lunch: protein bar Snack: chickpea snacks Supper: protein chickpea noodles/ salad with cheese, boiled egg/ recently tacos Snack: occ skinny cow ice cream sandwich, likes sweets, spoonful of peanut butter before bedtime Beverages: water, coffee and protein shake in am, occ macha tea with almond milk  Physical activity: dance video 40-60  minutes in am, yoga, walking 40 minutes 4-5 tims a week   Intervention:   Nutrition Care Education:   Basic nutrition: basic food groups; appropriate nutrient balance; appropriate meal and snack schedule; general nutrition guidelines    Weight control: determining reasonable weight loss rate; portion control; estimated energy needs for weight loss at 1400 kcal, provided guidance for 50% CHO, 20% pro, 30% fat; importance of adequate nutritional intake for healthy metabolism and to fuel physical activity; effects of stress on weight control; discussed intermittent fasting per patient question, advised eating every 3-5 hours during the day and avoiding longer fasting periods (while awake) on a routine basis.  Other:  vegetarian eating pattern and nutritional needs including adequate iron intake, B12, calcium; iron-rich foods and enhancing iron absorption  Other intervention notes: Patient is including protein sources regularly throughout the day. Energy intake might be below her needs given a high level of physical activity each day. She will plan to monitor her food intake to ensure she meets her energy needs. Her varied work schedule does result in varying times for meals/ snacks.  Established goals with direction from patient.   Nutritional Diagnosis:  [place]-2.1 Inpaired nutrition utilization As related to heavy menstrual bleeding.  As evidenced by iron deficiency. [place]-3.4 Unintentional weight gain As related to recent knee injury and steroid treatment, possibly stress.  As evidenced by patient with current BMI of 26.87.   Education Materials given:  Vegetarian [employer] with food lists, sample meal pattern Am I Getting Enough Iron? handout Visit summary with goals/ instructions   Learner/ who was taught:  Patient    Level of understanding: Verbalizes/ demonstrates competency   Demonstrated degree of understanding via:   Teach back Learning  barriers: None  Willingness to learn/ readiness for change: Eager, change in progress   Monitoring and Evaluation:  Dietary intake, exercise, iron status, and body weight      follow up:  08/09/22 at 9:30am

## 2022-06-14 ENCOUNTER — Encounter: Payer: Self-pay | Admitting: Nurse Practitioner

## 2022-06-15 ENCOUNTER — Other Ambulatory Visit: Payer: Self-pay

## 2022-06-15 ENCOUNTER — Ambulatory Visit (INDEPENDENT_AMBULATORY_CARE_PROVIDER_SITE_OTHER): Payer: Managed Care, Other (non HMO) | Admitting: Physician Assistant

## 2022-06-15 VITALS — BP 122/82 | HR 83 | Temp 98.5°F | Resp 16 | Ht 62.0 in | Wt 152.4 lb

## 2022-06-15 DIAGNOSIS — L659 Nonscarring hair loss, unspecified: Secondary | ICD-10-CM | POA: Diagnosis not present

## 2022-06-15 MED ORDER — ANORO ELLIPTA 62.5-25 MCG/ACT IN AEPB
1.0000 | INHALATION_SPRAY | Freq: Every day | RESPIRATORY_TRACT | 1 refills | Status: DC
Start: 2022-06-15 — End: 2022-09-04

## 2022-06-15 MED ORDER — ALBUTEROL SULFATE HFA 108 (90 BASE) MCG/ACT IN AERS: 2.0000 | INHALATION_SPRAY | Freq: Four times a day (QID) | RESPIRATORY_TRACT | 1 refills | Status: DC | PRN

## 2022-06-15 MED ORDER — METRONIDAZOLE 500 MG PO TABS: 500.0000 mg | ORAL_TABLET | Freq: Two times a day (BID) | ORAL | 0 refills | Status: DC

## 2022-06-15 NOTE — Telephone Encounter (Signed)
Pt called about her labs, per Dr Humphrey Rolls we advised that we normally don't treat for BV with the numbers it is at unless she is having any symptoms.  Pt advised that she was having more vaginal discharge than normal, so per [facility] we sent Flagyl 500 mg twice a day for 5 days.  Pt was given samples of Breo and Anoro at her last office visit to test and see which worked better and pt chose the [employer] so I sent a prescription to her pharmacy also.  Pt aware

## 2022-06-15 NOTE — Progress Notes (Signed)
[facility] [place],  [address]  Internal MEDICINE  Office Visit Note  Patient Name: Laura Morton  [id number]  [id number]  Date of Service: 06/15/2022  Chief Complaint  Patient presents with   Alopecia    Has bald spot     HPI Pt is here for a sick visit. -bald spot behind right ear that her boyfriend noticed the other day when she pulled her hair back. -Unsure how long this has been there. Denies having this before. -She has had hair thinning all over about 10 years ago when her thyroid was abnormal, but has been fine since then and denies diffuse hair loss now. Thyroid was also checked recently and was normal. Does not put hair up all the time.  Current Medication:  Outpatient Encounter Medications as of 06/15/2022  Medication Sig   albuterol (VENTOLIN HFA) 108 (90 Base) MCG/ACT inhaler Inhale 2 puffs into the lungs every 6 (six) hours as needed for wheezing or shortness of breath.   busPIRone (BUSPAR) 10 MG tablet Take 20 mg by mouth 3 (three) times daily.   DULoxetine HCl 40 MG CPEP Take 40 mg by mouth daily.   Ferrous Sulfate (IRON PO) Take by mouth. Pt takes the slow release   hydrOXYzine (ATARAX/VISTARIL) 25 MG tablet Take 1 tablet (25 mg total) by mouth 3 (three) times daily as needed.   metroNIDAZOLE (FLAGYL) 500 MG tablet Take 1 tablet (500 mg total) by mouth 2 (two) times daily.   Multiple Vitamin (MULTIVITAMIN) tablet Take 1 tablet by mouth daily.   Spacer/Aero-Holding Chambers (AEROCHAMBER PLUS) inhaler Use as instructed   Tdap (BOOSTRIX) 5-2.5-18.5 LF-MCG/0.5 injection Inject 0.5 mLs into the muscle once.   umeclidinium-vilanterol (ANORO ELLIPTA) 62.5-25 MCG/ACT AEPB Inhale 1 puff into the lungs daily.   No facility-administered encounter medications on file as of 06/15/2022.      Medical History: Past Medical History:  Diagnosis Date   Asthma    Environmental allergies    Knee fracture, left    Vertigo      Vital  Signs: BP 122/82   Pulse 83   Temp 98.5 F (36.9 C)   Resp 16   Ht '5\' 2"'$  (1.575 m)   Wt 152 lb 6.4 oz (69.1 kg)   SpO2 99%   BMI 27.87 kg/m    Review of Systems  Constitutional:  Negative for fatigue and fever.  HENT:  Negative for congestion, mouth sores and postnasal drip.   Respiratory:  Negative for cough.   Cardiovascular:  Negative for chest pain.  Genitourinary:  Negative for flank pain.  Skin:        Bald spot  Psychiatric/Behavioral: Negative.      Physical Exam Vitals reviewed.  Constitutional:      General: She is not in acute distress.    Appearance: Normal appearance. She is normal weight. She is not ill-appearing.  HENT:     Head: Normocephalic and atraumatic.  Eyes:     Pupils: Pupils are equal, round, and reactive to light.  Cardiovascular:     Rate and Rhythm: Normal rate and regular rhythm.  Pulmonary:     Effort: Pulmonary effort is normal. No respiratory distress.  Skin:    General: Skin is warm and dry.     Comments: 1inch bald spot behind right ear  Neurological:     Mental Status: She is alert and oriented to person, place, and time.  Psychiatric:        Mood  and Affect: Mood normal.        Behavior: Behavior normal.       Assessment/Plan: 1. Alopecia Will refer to dermatology for further eval and management. - Ambulatory referral to Dermatology   General Counseling: kaliegh willadsen understanding of the findings of todays visit and agrees with plan of treatment. I have discussed any further diagnostic evaluation that may be needed or ordered today. We also reviewed her medications today. she has been encouraged to call the office with any questions or concerns that should arise related to todays visit.    Counseling:    Orders Placed This Encounter  Procedures   Ambulatory referral to Dermatology    No orders of the defined types were placed in this encounter.   Time spent:25 Minutes

## 2022-06-24 ENCOUNTER — Encounter: Payer: Self-pay | Admitting: Nurse Practitioner

## 2022-07-10 ENCOUNTER — Encounter: Payer: Self-pay | Admitting: Nurse Practitioner

## 2022-07-13 ENCOUNTER — Encounter: Payer: Self-pay | Admitting: Nurse Practitioner

## 2022-07-13 ENCOUNTER — Telehealth: Payer: Managed Care, Other (non HMO) | Admitting: Nurse Practitioner

## 2022-07-13 VITALS — Temp 97.1°F | Resp 16 | Ht 62.0 in | Wt 142.0 lb

## 2022-07-13 DIAGNOSIS — J069 Acute upper respiratory infection, unspecified: Secondary | ICD-10-CM | POA: Diagnosis not present

## 2022-07-13 DIAGNOSIS — U071 COVID-19: Secondary | ICD-10-CM | POA: Diagnosis not present

## 2022-07-13 DIAGNOSIS — B029 Zoster without complications: Secondary | ICD-10-CM | POA: Diagnosis not present

## 2022-07-13 MED ORDER — VALACYCLOVIR HCL 500 MG PO TABS: 500.0000 mg | ORAL_TABLET | Freq: Three times a day (TID) | ORAL | 0 refills | Status: AC

## 2022-07-13 NOTE — Progress Notes (Signed)
[facility] [place], [place] [address]  Internal MEDICINE  Telephone Visit  Patient Name: Laura Morton  [id number]  [id number]  Date of Service: 07/13/2022  I connected with the patient at [address] by telephone and verified the patients identity using two identifiers.   I discussed the limitations, risks, security and privacy concerns of performing an evaluation and management service by telephone and the availability of in person appointments. I also discussed with the patient that there may be a patient responsible charge related to the service.  The patient expressed understanding and agrees to proceed.    Chief Complaint  Patient presents with   Telephone Assessment   Telephone Screen    [phone]    Covid Positive    Sneezing, runny nose, voice change, congestion some yellow mucus  sx's started Monday    HPI Laura Morton presents for a telehealth virtual visit for covid positive URI. Reports sneezing, runny nose, hoarseness, sore throat, slight headache, nasal and chest congestion with thick yellow mucous. Symptoms started Monday. Symptoms are tolerable with OTC symptomatic treatment. Not interested in taking paxlovid or molnupiravir. Also has small clusters of shingles-like pustular crusting rash near the lateral corner of the right eye. Burning and sharp pain before the rash started crusting. Her pharmacist said it looked like shingles.  Interested in shingles vaccine, but not of age to get it.    Current Medication: Outpatient Encounter Medications as of 07/13/2022  Medication Sig   albuterol (VENTOLIN HFA) 108 (90 Base) MCG/ACT inhaler Inhale 2 puffs into the lungs every 6 (six) hours as needed for wheezing or shortness of breath.   busPIRone (BUSPAR) 10 MG tablet Take 20 mg by mouth 3 (three) times daily.   DULoxetine HCl 40 MG CPEP Take 40 mg by mouth daily.   Ferrous Sulfate (IRON PO) Take by mouth. Pt takes the slow release   hydrOXYzine  (ATARAX/VISTARIL) 25 MG tablet Take 1 tablet (25 mg total) by mouth 3 (three) times daily as needed.   metroNIDAZOLE (FLAGYL) 500 MG tablet Take 1 tablet (500 mg total) by mouth 2 (two) times daily.   Multiple Vitamin (MULTIVITAMIN) tablet Take 1 tablet by mouth daily.   Spacer/Aero-Holding Chambers (AEROCHAMBER PLUS) inhaler Use as instructed   Tdap (BOOSTRIX) 5-2.5-18.5 LF-MCG/0.5 injection Inject 0.5 mLs into the muscle once.   umeclidinium-vilanterol (ANORO ELLIPTA) 62.5-25 MCG/ACT AEPB Inhale 1 puff into the lungs daily.   valACYclovir (VALTREX) 500 MG tablet Take 1 tablet (500 mg total) by mouth 3 (three) times daily for 7 days.   No facility-administered encounter medications on file as of 07/13/2022.    Surgical History: History reviewed. No pertinent surgical history.  Medical History: Past Medical History:  Diagnosis Date   Asthma    Environmental allergies    Knee fracture, left    Vertigo     Family History: Family History  Problem Relation Age of Onset   Diabetes Mother    Lupus Mother    Hypertension Mother    Hypertension Father    Heart disease Sister    Spina bifida Sister     Social History   Socioeconomic History   Marital status: Significant Other    Spouse name: Not on file   Number of children: Not on file   Years of education: Not on file   Highest education level: Not on file  Occupational History   Not on file  Tobacco Use   Smoking status: Never   Smokeless tobacco: Never  Vaping Use   Vaping Use: Never used  Substance and Sexual Activity   Alcohol use: Yes    Comment: on occasion   Drug use: No   Sexual activity: Yes    Partners: Male    Birth control/protection: Pill  Other Topics Concern   Not on file  Social History Narrative   Not on file   Social Determinants of Health   Financial Resource Strain: Not on file  Food Insecurity: Not on file  Transportation Needs: Not on file  Physical Activity: Not on file  Stress: Not on  file  Social Connections: Not on file  Intimate Partner Violence: Not on file      Review of Systems  Constitutional:  Positive for fatigue. Negative for chills and fever.  HENT:  Positive for congestion, postnasal drip, rhinorrhea, sneezing and sore throat.   Respiratory:  Positive for cough. Negative for chest tightness, shortness of breath and wheezing.   Cardiovascular: Negative.  Negative for chest pain and palpitations.  Gastrointestinal: Negative.  Negative for abdominal pain, diarrhea, nausea and vomiting.  Musculoskeletal:  Negative for myalgias.  Neurological:  Positive for headaches.    Vital Signs: Temp (!) 97.1 F (36.2 C)   Resp 16   Ht '5\' 2"'$  (1.575 m)   Wt 142 lb (64.4 kg)   BMI 25.97 kg/m    Observation/Objective: She is alert and oriented and engages in conversation appropriately. She does not appear to be in any acute distress over video call.     Assessment/Plan: 1. Upper respiratory tract infection due to COVID-19 virus Symptoms are tolerable and starting to improve. Declined antiviral treatment. Illness is starting to resolve. Will wear mask at work. 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   2. Herpes zoster without complication Small cluster of shingles-like crusted pustules about 1-2 cm away from lateral corner of right eye. Will treat with valacyclovir. Also patient wants to get shingles vaccine. Will look into this.  - valACYclovir (VALTREX) 500 MG tablet; Take 1 tablet (500 mg total) by mouth 3 (three) times daily for 7 days.  Dispense: 21 tablet; Refill: 0   General Counseling: Laura Morton understanding of the findings of today's phone visit and agrees with plan of treatment. I have discussed any further diagnostic evaluation that may be needed or ordered today. We also reviewed her medications today. she has been encouraged to call the office with any questions or concerns that should arise related to todays  visit.  Return if symptoms worsen or fail to improve.   No orders of the defined types were placed in this encounter.   Meds ordered this encounter  Medications   valACYclovir (VALTREX) 500 MG tablet    Sig: Take 1 tablet (500 mg total) by mouth 3 (three) times daily for 7 days.    Dispense:  21 tablet    Refill:  0    Time spent:10 Minutes Time spent with patient included reviewing progress notes, labs, imaging studies, and discussing plan for follow up.  [place] Controlled Substance Database was reviewed by me for overdose risk score (ORS) if appropriate.  This patient was seen by Jonetta Osgood, FNP-C in collaboration with Dr. Clayborn Bigness as a part of collaborative care agreement.  Ravenna Legore R. Valetta Fuller, MSN, FNP-C Internal medicine

## 2022-07-20 ENCOUNTER — Ambulatory Visit
Admission: RE | Admit: 2022-07-20 | Discharge: 2022-07-20 | Disposition: A | Discharge status: Home / Self Care | Payer: Managed Care, Other (non HMO) | Source: Ambulatory Visit | Attending: Internal Medicine | Admitting: Internal Medicine

## 2022-07-20 DIAGNOSIS — Z1231 Encounter for screening mammogram for malignant neoplasm of breast: Secondary | ICD-10-CM

## 2022-07-21 ENCOUNTER — Other Ambulatory Visit: Payer: Self-pay | Admitting: Internal Medicine

## 2022-07-21 DIAGNOSIS — N6489 Other specified disorders of breast: Secondary | ICD-10-CM

## 2022-07-21 DIAGNOSIS — R928 Other abnormal and inconclusive findings on diagnostic imaging of breast: Secondary | ICD-10-CM

## 2022-07-21 DIAGNOSIS — N63 Unspecified lump in unspecified breast: Secondary | ICD-10-CM

## 2022-08-09 ENCOUNTER — Encounter: Payer: Self-pay | Admitting: Dietician

## 2022-08-09 ENCOUNTER — Encounter: Payer: Managed Care, Other (non HMO) | Attending: Nurse Practitioner | Admitting: Dietician

## 2022-08-09 VITALS — Ht 62.0 in | Wt 150.7 lb

## 2022-08-09 DIAGNOSIS — D509 Iron deficiency anemia, unspecified: Secondary | ICD-10-CM | POA: Insufficient documentation

## 2022-08-09 DIAGNOSIS — R635 Abnormal weight gain: Secondary | ICD-10-CM | POA: Insufficient documentation

## 2022-08-09 NOTE — Patient Instructions (Signed)
Include a small serving of a healthy carb with breakfast, either before or after exercise, to help allow protein to be used for better muscle repair/ building.  Keep up the healthy food choices and regular exercise, even if positive results are slow.

## 2022-08-09 NOTE — Progress Notes (Signed)
Medical Nutrition Therapy: Visit start time: 1000  end time: 1030  Assessment:  Diagnosis: abnormal weight gain; iron deficiency anemia Medical history changes: history of COVID, yeast infection, shingles  Psychosocial issues/ stress concerns: anxiety, depression  Medications, supplement changes: no changes   Current weight: 150.7lbs with sweater, shoes  Height: 5'2" BMI: 27.56  Progress and evaluation:  Patient reports improved iron deficiency symptoms; she will have iron stores retested 08/2022. She had COVID with mild symptoms, then yeast infection, the treatment for which led to shingles per patient; all since previous visit on 06/12/22. She did not wish to see today's weight measurement; she voices frustration with lack of weight loss despite effort to be active and control diet.     Dietary Intake:  Usual eating pattern includes 3 meals (2 liquid) and 1 snacks per day.  Breakfast: protein shake with 21g protein, coffee 230 kcal Snack: none Lunch: protein shake, 21g protein + banana, almond milk, 2 Tablespoons creamer 330 kcal Snack: none Supper: black beans, black eyed peas, brown rice/ chickepea pasta; has had Boca burger patties 450-500 kcal + 100kcal vegan butter Snack: yasso frozen yogurt, spoon of peanut butter 100-200 kcal Beverages: water, coffee with protein shake  Physical activity: walking 40 minutes 4x a week + 5 mile walk once a week  Intervention:   Nutrition Care Education:  Basic nutrition: reviewed basic food groups; appropriate nutrient balance; appropriate meal and snack schedule; general nutrition guidelines    Weight control: reviewed progress since previous visit; tracking food intake; importance of adequate energy intake to fuel physical activity and promote healthy metabolism  Other intervention notes: Patient is carefully monitoring and controlling kcal intake; discussed the possibility that she is overly restricting calories given her level of  physical activity. Set goal to add small amount of healthy carb food to breakfast and/or lunch meal times. No additional follow up scheduled at this time; patient will schedule after next medical checkup if needed; offered option of weigh-in if desired.   Nutritional Diagnosis:  [place]-2.2 Altered nutrition-related laboratory As related to iron deficiency anemia.  As evidenced by deficiency symptoms improving with iron supplementation. [place]-3.4 Unintentional weight gain As related to history of injury, steroid treatment, illness, and stress.  As evidenced by patient with current BMI of 27.56, following low calorie eating pattern to promote weight loss.   Education Materials given:  Risk analyst who was taught:  Patient   Level of understanding: Verbalizes/ demonstrates competency   Demonstrated degree of understanding via:   Teach back Learning barriers: None  Willingness to learn/ readiness for change: Eager, change in progress  Monitoring and Evaluation:  follow up: prn

## 2022-08-10 ENCOUNTER — Encounter: Payer: Self-pay | Admitting: Nurse Practitioner

## 2022-08-10 MED ORDER — FLUCONAZOLE 150 MG PO TABS: 150.0000 mg | ORAL_TABLET | Freq: Once | ORAL | 0 refills | Status: AC

## 2022-08-14 ENCOUNTER — Other Ambulatory Visit: Payer: Self-pay | Admitting: Internal Medicine

## 2022-08-14 MED ORDER — FLUCONAZOLE 150 MG PO TABS: ORAL_TABLET | ORAL | 0 refills | Status: DC

## 2022-08-22 ENCOUNTER — Other Ambulatory Visit
Admission: RE | Admit: 2022-08-22 | Discharge: 2022-08-22 | Disposition: A | Discharge status: Home / Self Care | Payer: Managed Care, Other (non HMO) | Source: Ambulatory Visit | Attending: Nurse Practitioner | Admitting: Nurse Practitioner

## 2022-08-22 ENCOUNTER — Ambulatory Visit (INDEPENDENT_AMBULATORY_CARE_PROVIDER_SITE_OTHER): Payer: Managed Care, Other (non HMO) | Admitting: Nurse Practitioner

## 2022-08-22 ENCOUNTER — Encounter: Payer: Self-pay | Admitting: Nurse Practitioner

## 2022-08-22 VITALS — BP 112/80 | HR 77 | Temp 97.7°F | Resp 16 | Ht 62.0 in | Wt 156.0 lb

## 2022-08-22 DIAGNOSIS — B3731 Acute candidiasis of vulva and vagina: Secondary | ICD-10-CM | POA: Diagnosis not present

## 2022-08-22 DIAGNOSIS — F339 Major depressive disorder, recurrent, unspecified: Secondary | ICD-10-CM

## 2022-08-22 DIAGNOSIS — D508 Other iron deficiency anemias: Secondary | ICD-10-CM | POA: Insufficient documentation

## 2022-08-22 DIAGNOSIS — N920 Excessive and frequent menstruation with regular cycle: Secondary | ICD-10-CM

## 2022-08-22 DIAGNOSIS — F419 Anxiety disorder, unspecified: Secondary | ICD-10-CM

## 2022-08-22 LAB — CBC WITH DIFFERENTIAL/PLATELET
Abs Immature Granulocytes: 0 10*3/uL (ref 0.00–0.07)
Basophils Absolute: 0 10*3/uL (ref 0.0–0.1)
Basophils Relative: 1 %
Eosinophils Absolute: 0.2 10*3/uL (ref 0.0–0.5)
Eosinophils Relative: 5 %
HCT: 43.1 % (ref 36.0–46.0)
Hemoglobin: 13.6 g/dL (ref 12.0–15.0)
Immature Granulocytes: 0 %
Lymphocytes Relative: 31 %
Lymphs Abs: 1.2 10*3/uL (ref 0.7–4.0)
MCH: 26.8 pg (ref 26.0–34.0)
MCHC: 31.6 g/dL (ref 30.0–36.0)
MCV: 84.8 fL (ref 80.0–100.0)
Monocytes Absolute: 0.3 10*3/uL (ref 0.1–1.0)
Monocytes Relative: 9 %
Neutro Abs: 2 10*3/uL (ref 1.7–7.7)
Neutrophils Relative %: 54 %
Platelets: 308 10*3/uL (ref 150–400)
RBC: 5.08 MIL/uL (ref 3.87–5.11)
RDW: 16.2 % — ABNORMAL HIGH (ref 11.5–15.5)
WBC: 3.7 10*3/uL — ABNORMAL LOW (ref 4.0–10.5)
nRBC: 0 % (ref 0.0–0.2)

## 2022-08-22 LAB — IRON AND TIBC
Iron: 48 ug/dL (ref 28–170)
Saturation Ratios: 13 % (ref 10.4–31.8)
TIBC: 377 ug/dL (ref 250–450)
UIBC: 329 ug/dL

## 2022-08-22 LAB — FERRITIN: Ferritin: 11 ng/mL (ref 11–307)

## 2022-08-22 MED ORDER — FLUCONAZOLE 150 MG PO TABS: ORAL_TABLET | ORAL | 0 refills | Status: DC

## 2022-08-22 NOTE — Progress Notes (Signed)
[facility] [place], [place] [address]  Internal MEDICINE  Office Visit Note  Patient Name: Laura Morton  [id number]  [id number]  Date of Service: 08/22/2022  Chief Complaint  Patient presents with   Follow-up    Follow up labs review     HPI Laura Morton presents for follow-up visit for iron deficiency anemia and heavy menstrual cycles. Iron deficiency anemia --Labs were drawn in June, significantly low ferritin, slight anemia and low serum iron.  She has been consuming foods high in iron and taking iron supplement  Reports having yeast infection --happens right before cycle  Requesting referral for therapy for anxiety and depression. I will take over medications.     Current Medication: Outpatient Encounter Medications as of 08/22/2022  Medication Sig   albuterol (VENTOLIN HFA) 108 (90 Base) MCG/ACT inhaler Inhale 2 puffs into the lungs every 6 (six) hours as needed for wheezing or shortness of breath.   Ferrous Sulfate (IRON PO) Take by mouth. Pt takes the slow release   hydrOXYzine (ATARAX/VISTARIL) 25 MG tablet Take 1 tablet (25 mg total) by mouth 3 (three) times daily as needed.   Multiple Vitamin (MULTIVITAMIN) tablet Take 1 tablet by mouth daily.   Spacer/Aero-Holding Chambers (AEROCHAMBER PLUS) inhaler Use as instructed   Tdap (BOOSTRIX) 5-2.5-18.5 LF-MCG/0.5 injection Inject 0.5 mLs into the muscle once.   [DISCONTINUED] busPIRone (BUSPAR) 10 MG tablet Take 20 mg by mouth 3 (three) times daily.   [DISCONTINUED] DULoxetine HCl 40 MG CPEP Take 40 mg by mouth daily.   [DISCONTINUED] fluconazole (DIFLUCAN) 150 MG tablet Take one tab po q week for fungal infection   [DISCONTINUED] umeclidinium-vilanterol (ANORO ELLIPTA) 62.5-25 MCG/ACT AEPB Inhale 1 puff into the lungs daily.   fluconazole (DIFLUCAN) 150 MG tablet Take 1 tablet once monthly by mouth at onset of symptom prior to start of cycle.   No facility-administered encounter medications on file as of  08/22/2022.    Surgical History: Past Surgical History:  Procedure Laterality Date   BREAST BIOPSY Left 09/27/2022   US biopsy/ coil clip/ path pending   BREAST BIOPSY Right 09/27/2022   US biopsy/ heart clip/ path pending   BREAST BIOPSY Right 09/27/2022   [place] RT BREAST BX W LOC DEV 1ST LESION IMG BX SPEC US GUIDE 09/27/2022 [facility]   BREAST BIOPSY Left 09/27/2022   [place] LT BREAST BX W LOC DEV 1ST LESION IMG BX Advance US GUIDE 09/27/2022 [facility]    Medical History: Past Medical History:  Diagnosis Date   Asthma    Environmental allergies    Knee fracture, left    Vertigo     Family History: Family History  Problem Relation Age of Onset   Diabetes Mother    Lupus Mother    Hypertension Mother    Hypertension Father    Heart disease Sister    Spina bifida Sister     Social History   Socioeconomic History   Marital status: Significant Other    Spouse name: Not on file   Number of children: Not on file   Years of education: Not on file   Highest education level: Not on file  Occupational History   Not on file  Tobacco Use   Smoking status: Never   Smokeless tobacco: Never  Vaping Use   Vaping Use: Never used  Substance and Sexual Activity   Alcohol use: Yes    Comment: on occasion   Drug use: No   Sexual activity: Yes  Partners: Male    Birth control/protection: Pill  Other Topics Concern   Not on file  Social History Narrative   Not on file   Social Determinants of Health   Financial Resource Strain: Not on file  Food Insecurity: Not on file  Transportation Needs: Not on file  Physical Activity: Not on file  Stress: Not on file  Social Connections: Not on file  Intimate Partner Violence: Not on file      Review of Systems  Constitutional:  Positive for fatigue. Negative for chills and unexpected weight change.  HENT:  Negative for congestion, postnasal drip, rhinorrhea, sneezing and sore throat.   Eyes:  Negative for redness.   Respiratory:  Negative for cough, chest tightness, shortness of breath and wheezing.   Cardiovascular:  Negative for chest pain and palpitations.  Gastrointestinal:  Negative for abdominal pain, constipation, diarrhea, nausea and vomiting.  Genitourinary:  Negative for dysuria and frequency.  Musculoskeletal:  Positive for arthralgias (left hip pain) and myalgias. Negative for back pain, joint swelling and neck pain.  Skin:  Negative for rash.  Neurological: Negative.  Negative for tremors and numbness.  Hematological:  Negative for adenopathy. Does not bruise/bleed easily.  Psychiatric/Behavioral:  Negative for behavioral problems (Depression), sleep disturbance and suicidal ideas. The patient is not nervous/anxious.     Vital Signs: BP 112/80   Pulse 77   Temp 97.7 F (36.5 C)   Resp 16   Ht '5\' 2"'$  (1.575 m)   Wt 156 lb (70.8 kg)   SpO2 100%   BMI 28.53 kg/m    Physical Exam Vitals reviewed.  Constitutional:      General: She is not in acute distress.    Appearance: Normal appearance. She is normal weight. She is not ill-appearing.  HENT:     Head: Normocephalic and atraumatic.  Eyes:     Pupils: Pupils are equal, round, and reactive to light.  Cardiovascular:     Rate and Rhythm: Normal rate and regular rhythm.  Pulmonary:     Effort: Pulmonary effort is normal. No respiratory distress.  Neurological:     Mental Status: She is alert and oriented to person, place, and time.  Psychiatric:        Mood and Affect: Mood normal.        Behavior: Behavior normal.        Assessment/Plan: 1. Other iron deficiency anemia Repeat labs to reassess anemia and iron deficiency. - CBC with Differential/Platelet - Iron, TIBC and Ferritin Panel  2. Menorrhagia with regular cycle Additional labs ordered for further evaluation  - FSH/LH - Estradiol - Progesterone - Testosterone,Free and Total  3. Vulvovaginal candidiasis Prone to yeast infections at the start of her  cycle, prescription ordered.  - fluconazole (DIFLUCAN) 150 MG tablet; Take 1 tablet once monthly by mouth at onset of symptom prior to start of cycle.  Dispense: 12 tablet; Refill: 0  4. Depression, recurrent ([place]) Referred for therapy - Ambulatory referral to Psychology  5. Anxiety Referred for therapy - Ambulatory referral to Psychology   General Counseling: sianni cloninger understanding of the findings of todays visit and agrees with plan of treatment. I have discussed any further diagnostic evaluation that may be needed or ordered today. We also reviewed her medications today. she has been encouraged to call the office with any questions or concerns that should arise related to todays visit.    Orders Placed This Encounter  Procedures   CBC with Differential/Platelet   Iron,  TIBC and Ferritin Panel   FSH/LH   Estradiol   Progesterone   Testosterone,Free and Total   Ambulatory referral to Psychology    Meds ordered this encounter  Medications   fluconazole (DIFLUCAN) 150 MG tablet    Sig: Take 1 tablet once monthly by mouth at onset of symptom prior to start of cycle.    Dispense:  12 tablet    Refill:  0    Return in about 4 months (around 12/23/2022) for F/U, [place] PCP for anemia.   Total time spent:30 Minutes Time spent includes review of chart, medications, test results, and follow up plan with the patient.   [place] Controlled Substance Database was reviewed by me.  This patient was seen by Jonetta Osgood, FNP-C in collaboration with Dr. Clayborn Bigness as a part of collaborative care agreement.   Kodie Pick R. Valetta Fuller, MSN, FNP-C Internal medicine

## 2022-08-23 LAB — PROGESTERONE: Progesterone: 0.2 ng/mL

## 2022-08-23 LAB — FSH/LH
FSH: 7.1 m[IU]/mL
LH: 6.5 m[IU]/mL

## 2022-08-23 LAB — ESTRADIOL: Estradiol: 51.1 pg/mL

## 2022-08-26 LAB — TESTOSTERONE,FREE AND TOTAL
Testosterone, Free: 0.4 pg/mL (ref 0.0–4.2)
Testosterone: 7 ng/dL — ABNORMAL LOW (ref 8–60)

## 2022-08-28 ENCOUNTER — Ambulatory Visit
Admission: RE | Admit: 2022-08-28 | Discharge: 2022-08-28 | Disposition: A | Discharge status: Home / Self Care | Payer: Managed Care, Other (non HMO) | Source: Ambulatory Visit | Attending: Internal Medicine | Admitting: Internal Medicine

## 2022-08-28 ENCOUNTER — Encounter: Payer: Self-pay | Admitting: Nurse Practitioner

## 2022-08-28 ENCOUNTER — Other Ambulatory Visit: Payer: Self-pay | Admitting: Internal Medicine

## 2022-08-28 DIAGNOSIS — R928 Other abnormal and inconclusive findings on diagnostic imaging of breast: Secondary | ICD-10-CM

## 2022-08-28 DIAGNOSIS — N63 Unspecified lump in unspecified breast: Secondary | ICD-10-CM | POA: Diagnosis present

## 2022-08-28 DIAGNOSIS — N6489 Other specified disorders of breast: Secondary | ICD-10-CM | POA: Insufficient documentation

## 2022-08-28 NOTE — Progress Notes (Signed)
Pt needs biopsy

## 2022-09-01 NOTE — Telephone Encounter (Signed)
Please see

## 2022-09-03 ENCOUNTER — Other Ambulatory Visit: Payer: Self-pay | Admitting: Internal Medicine

## 2022-09-16 ENCOUNTER — Encounter: Payer: Self-pay | Admitting: Nurse Practitioner

## 2022-09-16 DIAGNOSIS — F339 Major depressive disorder, recurrent, unspecified: Secondary | ICD-10-CM

## 2022-09-18 MED ORDER — BUSPIRONE HCL 10 MG PO TABS: 20.0000 mg | ORAL_TABLET | Freq: Three times a day (TID) | ORAL | 2 refills | Status: DC

## 2022-09-18 MED ORDER — DULOXETINE HCL 40 MG PO CPEP: 40.0000 mg | ORAL_CAPSULE | Freq: Every day | ORAL | 3 refills | Status: DC

## 2022-09-27 ENCOUNTER — Ambulatory Visit
Admission: RE | Admit: 2022-09-27 | Discharge: 2022-09-27 | Disposition: A | Discharge status: Home / Self Care | Payer: Managed Care, Other (non HMO) | Source: Ambulatory Visit | Attending: Internal Medicine | Admitting: Internal Medicine

## 2022-09-27 DIAGNOSIS — N6320 Unspecified lump in the left breast, unspecified quadrant: Secondary | ICD-10-CM | POA: Diagnosis not present

## 2022-09-27 DIAGNOSIS — R928 Other abnormal and inconclusive findings on diagnostic imaging of breast: Secondary | ICD-10-CM | POA: Insufficient documentation

## 2022-09-27 DIAGNOSIS — N63 Unspecified lump in unspecified breast: Secondary | ICD-10-CM

## 2022-09-27 DIAGNOSIS — D241 Benign neoplasm of right breast: Secondary | ICD-10-CM | POA: Insufficient documentation

## 2022-09-27 DIAGNOSIS — N6310 Unspecified lump in the right breast, unspecified quadrant: Secondary | ICD-10-CM | POA: Insufficient documentation

## 2022-09-27 DIAGNOSIS — N6489 Other specified disorders of breast: Secondary | ICD-10-CM | POA: Diagnosis not present

## 2022-09-27 DIAGNOSIS — Z1239 Encounter for other screening for malignant neoplasm of breast: Secondary | ICD-10-CM | POA: Insufficient documentation

## 2022-09-27 HISTORY — PX: BREAST BIOPSY: SHX20

## 2022-09-27 MED ORDER — LIDOCAINE HCL (PF) 1 % IJ SOLN
4.0000 mL | Freq: Once | INTRAMUSCULAR | Status: AC
  Administered 2022-09-27: 4 mL
  Filled 2022-09-27: qty 4

## 2022-09-27 MED ORDER — LIDOCAINE-EPINEPHRINE 1 %-1:100000 IJ SOLN
10.0000 mL | Freq: Once | INTRAMUSCULAR | Status: AC
  Administered 2022-09-27: 10 mL
  Filled 2022-09-27: qty 10

## 2022-09-28 LAB — SURGICAL PATHOLOGY

## 2022-09-30 NOTE — Telephone Encounter (Signed)
Please write a note

## 2022-10-01 ENCOUNTER — Encounter: Payer: Self-pay | Admitting: Nurse Practitioner

## 2022-10-02 ENCOUNTER — Telehealth: Payer: Self-pay | Admitting: Nurse Practitioner

## 2022-10-02 IMAGING — CR DG KNEE COMPLETE 4+V*L*
1 series · 5 of 5 positions shown · non-contrast
Comparison: None.

CLINICAL DATA: Pain

EXAM:
LEFT KNEE - COMPLETE 4+ VIEW

[Series 1: dg knee complete 4 views left · 0.14mm/px · 5 of 5 slices shown]
[im 1/5]
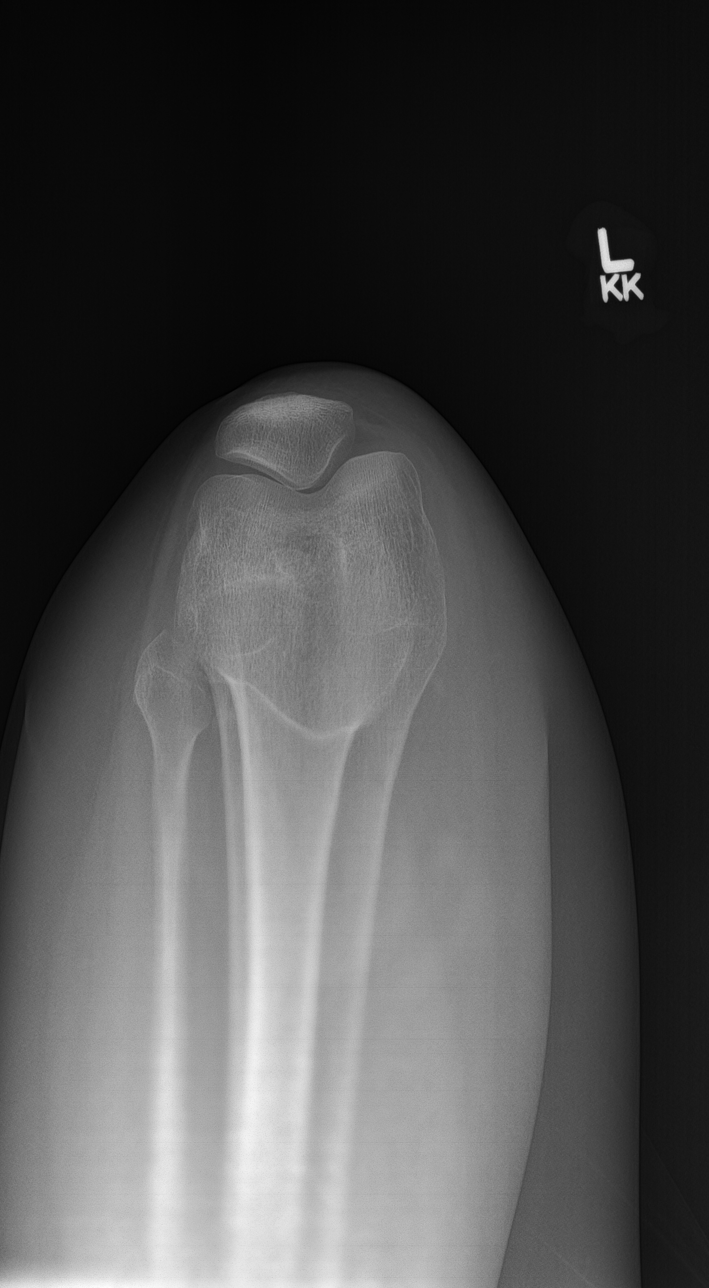
[im 2/5]
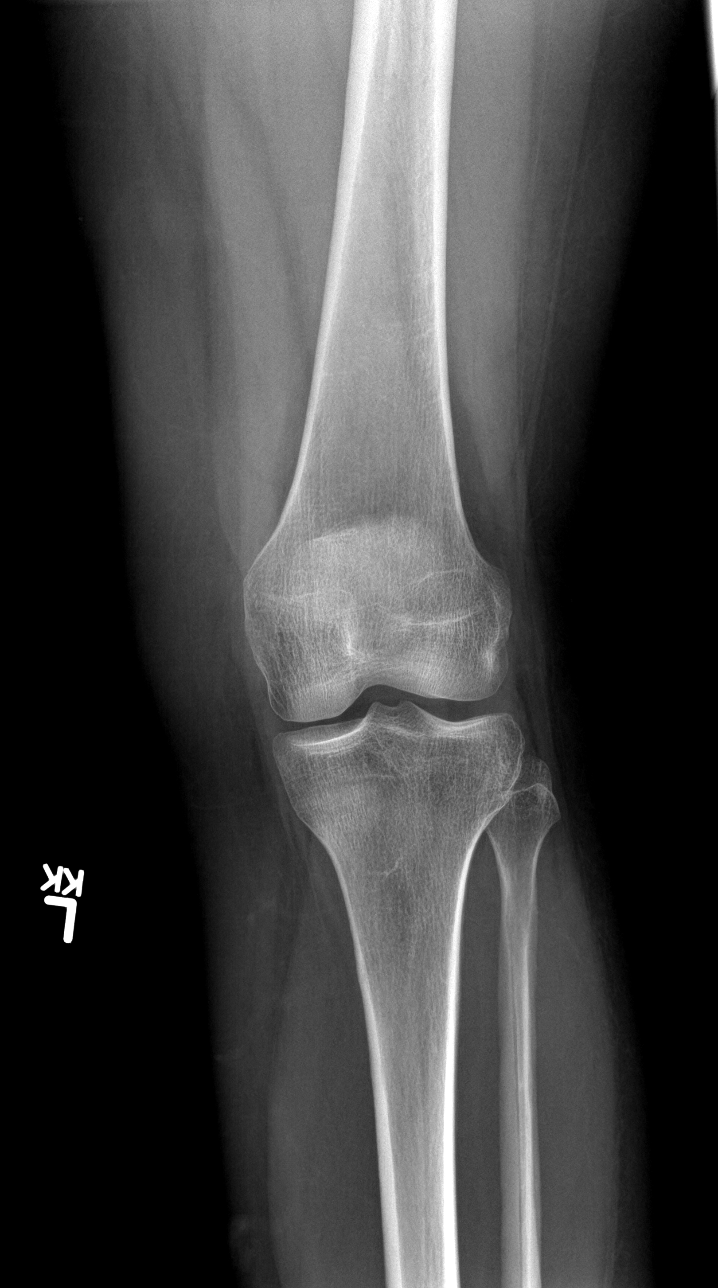
[im 3/5]
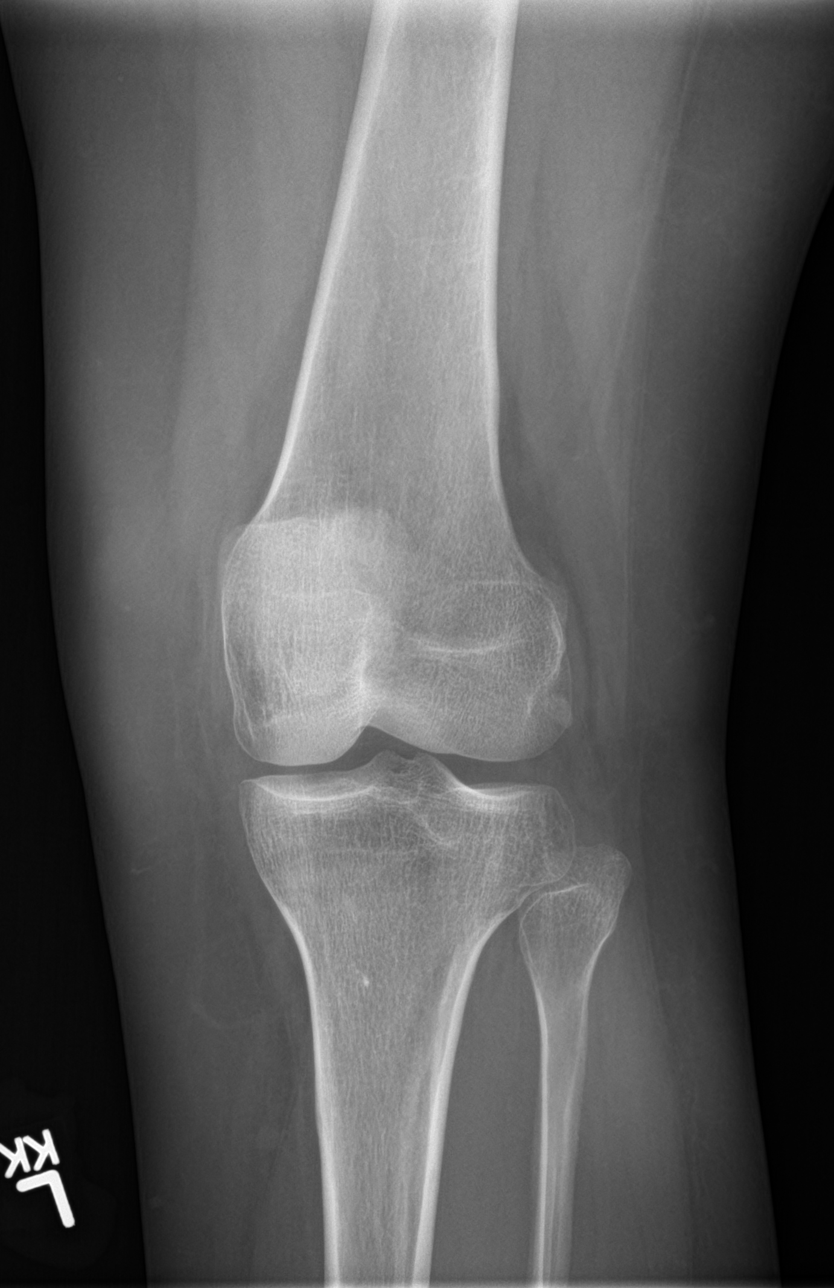
[im 4/5]
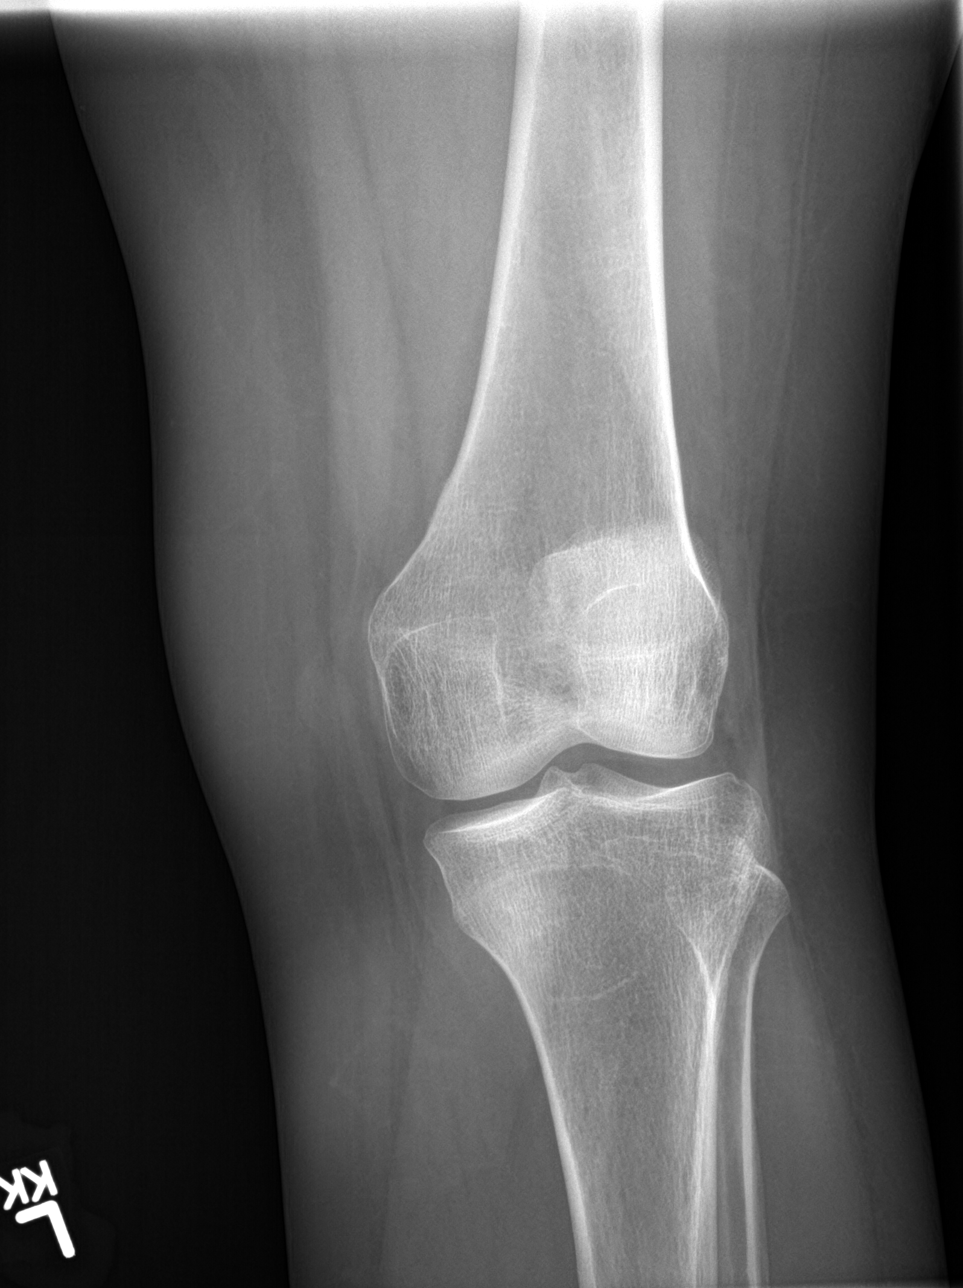
[im 5/5]
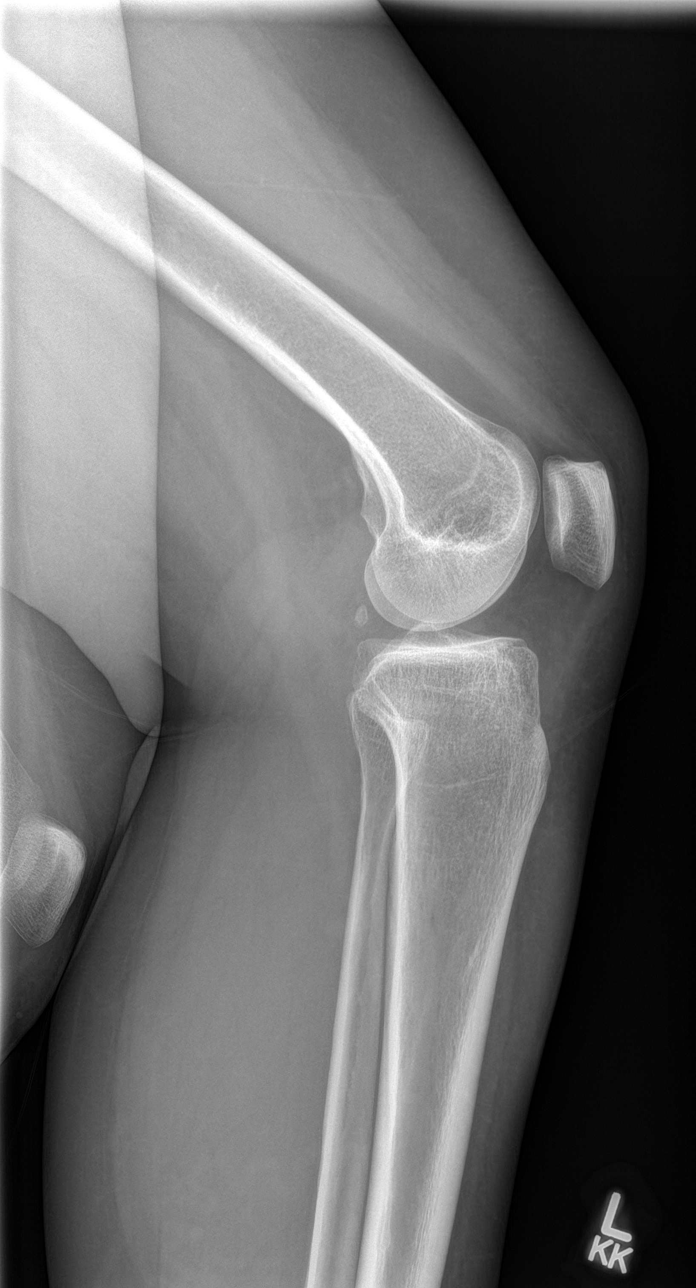

[5 of 5 positions shown; findings below may reference images not displayed]

FINDINGS: Frontal, lateral, bilateral oblique, and sunrise patellar images
were obtained. There is no fracture or dislocation. No joint
effusion. Joint spaces appear normal. No erosive change.
IMPRESSION: No fracture or dislocation. No joint effusion. No evident
arthropathy.

## 2022-10-02 NOTE — Telephone Encounter (Signed)
Emailed Pearson Grippe to see if she can counsel patient. Notified patient-Toni

## 2022-10-02 NOTE — Telephone Encounter (Signed)
Emailed work note to patient-Toni 

## 2022-10-02 NOTE — Telephone Encounter (Signed)
[place] referral emailed to [employer] with [employer]

## 2022-10-10 ENCOUNTER — Encounter: Payer: Self-pay | Admitting: Nurse Practitioner

## 2022-10-16 ENCOUNTER — Ambulatory Visit (INDEPENDENT_AMBULATORY_CARE_PROVIDER_SITE_OTHER): Payer: Managed Care, Other (non HMO) | Admitting: Nurse Practitioner

## 2022-10-16 ENCOUNTER — Encounter: Payer: Self-pay | Admitting: Nurse Practitioner

## 2022-10-16 VITALS — BP 132/78 | HR 75 | Temp 97.9°F | Resp 16 | Ht 62.0 in | Wt 163.4 lb

## 2022-10-16 DIAGNOSIS — F339 Major depressive disorder, recurrent, unspecified: Secondary | ICD-10-CM | POA: Diagnosis not present

## 2022-10-16 DIAGNOSIS — Z6829 Body mass index (BMI) 29.0-29.9, adult: Secondary | ICD-10-CM

## 2022-10-16 DIAGNOSIS — R635 Abnormal weight gain: Secondary | ICD-10-CM | POA: Diagnosis not present

## 2022-10-16 MED ORDER — HYDROXYZINE HCL 25 MG PO TABS: 25.0000 mg | ORAL_TABLET | Freq: Three times a day (TID) | ORAL | 2 refills | Status: DC | PRN

## 2022-10-16 MED ORDER — PHENTERMINE HCL 37.5 MG PO TABS: 37.5000 mg | ORAL_TABLET | Freq: Every day | ORAL | 0 refills | Status: DC

## 2022-10-16 MED ORDER — DULOXETINE HCL 20 MG PO CPEP: 20.0000 mg | ORAL_CAPSULE | Freq: Every day | ORAL | 0 refills | Status: DC

## 2022-10-16 NOTE — Progress Notes (Signed)
[facility] [place], [place] [address]  Internal MEDICINE  Office Visit Note  Patient Name: Laura Morton  [id number]  [id number]  Date of Service: 10/16/2022  Chief Complaint  Patient presents with   Follow-up    Weight loss    HPI Niza presents for a follow up visit for weight loss and medication adjustments. Daily caloric intake is around 1400 calories per day. Engages in physical activity at least 5+ days per week, specific exercise regimen discussed, still gaining and not losing weight. She is worried that it is her medications causing her to gain weight. Both have a side effect of weight gain and loss. Metabolic test done today. Weight is 163 lbs and BMI is borderline obese. Patient's goal weight is 120-125 lbs. Will be starting psychotherapy soon. Wants to taper off duloxetine and buspirone.     Current Medication: Outpatient Encounter Medications as of 10/16/2022  Medication Sig   albuterol (VENTOLIN HFA) 108 (90 Base) MCG/ACT inhaler Inhale 2 puffs into the lungs every 6 (six) hours as needed for wheezing or shortness of breath.   ANORO ELLIPTA 62.5-25 MCG/ACT AEPB TAKE 1 PUFF BY MOUTH EVERY DAY   busPIRone (BUSPAR) 10 MG tablet Take 2 tablets (20 mg total) by mouth 3 (three) times daily.   DULoxetine (CYMBALTA) 20 MG capsule Take 1 capsule (20 mg total) by mouth daily.   Ferrous Sulfate (IRON PO) Take by mouth. Pt takes the slow release   fluconazole (DIFLUCAN) 150 MG tablet Take 1 tablet once monthly by mouth at onset of symptom prior to start of cycle.   Multiple Vitamin (MULTIVITAMIN) tablet Take 1 tablet by mouth daily.   phentermine (ADIPEX-P) 37.5 MG tablet Take 1 tablet (37.5 mg total) by mouth daily before breakfast.   Spacer/Aero-Holding Chambers (AEROCHAMBER PLUS) inhaler Use as instructed   Tdap (BOOSTRIX) 5-2.5-18.5 LF-MCG/0.5 injection Inject 0.5 mLs into the muscle once.   [DISCONTINUED] DULoxetine HCl 40 MG CPEP Take 40 mg by mouth  daily.   [DISCONTINUED] hydrOXYzine (ATARAX/VISTARIL) 25 MG tablet Take 1 tablet (25 mg total) by mouth 3 (three) times daily as needed.   hydrOXYzine (ATARAX) 25 MG tablet Take 1 tablet (25 mg total) by mouth 3 (three) times daily as needed.   No facility-administered encounter medications on file as of 10/16/2022.    Surgical History: Past Surgical History:  Procedure Laterality Date   BREAST BIOPSY Left 09/27/2022   US biopsy/ coil clip/ path pending   BREAST BIOPSY Right 09/27/2022   US biopsy/ heart clip/ path pending   BREAST BIOPSY Right 09/27/2022   [place] RT BREAST BX W LOC DEV 1ST LESION IMG BX SPEC US GUIDE 09/27/2022 [facility]   BREAST BIOPSY Left 09/27/2022   [place] LT BREAST BX W LOC DEV 1ST LESION IMG BX SPEC US GUIDE 09/27/2022 [facility]    Medical History: Past Medical History:  Diagnosis Date   Asthma    Environmental allergies    Knee fracture, left    Vertigo     Family History: Family History  Problem Relation Age of Onset   Diabetes Mother    Lupus Mother    Hypertension Mother    Hypertension Father    Heart disease Sister    Spina bifida Sister     Social History   Socioeconomic History   Marital status: Significant Other    Spouse name: Not on file   Number of children: Not on file   Years of education: Not on file  Highest education level: Not on file  Occupational History   Not on file  Tobacco Use   Smoking status: Never   Smokeless tobacco: Never  Vaping Use   Vaping Use: Never used  Substance and Sexual Activity   Alcohol use: Yes    Comment: on occasion   Drug use: No   Sexual activity: Yes    Partners: Male    Birth control/protection: Pill  Other Topics Concern   Not on file  Social History Narrative   Not on file   Social Determinants of Health   Financial Resource Strain: Not on file  Food Insecurity: Not on file  Transportation Needs: Not on file  Physical Activity: Not on file  Stress: Not on file   Social Connections: Not on file  Intimate Partner Violence: Not on file      Review of Systems  Constitutional:  Positive for fatigue and unexpected weight change. Negative for chills.  HENT:  Negative for congestion, postnasal drip, rhinorrhea, sneezing and sore throat.   Eyes:  Negative for redness.  Respiratory:  Negative for cough, chest tightness, shortness of breath and wheezing.   Cardiovascular:  Negative for chest pain and palpitations.  Gastrointestinal:  Negative for abdominal pain, constipation, diarrhea, nausea and vomiting.  Genitourinary:  Negative for dysuria and frequency.  Musculoskeletal:  Positive for myalgias. Negative for arthralgias, back pain, joint swelling and neck pain.  Skin:  Negative for rash.  Neurological: Negative.  Negative for tremors and numbness.  Hematological:  Negative for adenopathy. Does not bruise/bleed easily.  Psychiatric/Behavioral:  Negative for behavioral problems (Depression), sleep disturbance and suicidal ideas. The patient is not nervous/anxious.     Vital Signs: BP 132/78   Pulse 75   Temp 97.9 F (36.6 C)   Resp 16   Ht '5\' 2"'$  (1.575 m)   Wt 163 lb 6.4 oz (74.1 kg)   SpO2 99%   BMI 29.89 kg/m    Physical Exam Vitals reviewed.  Constitutional:      General: She is not in acute distress.    Appearance: Normal appearance. She is normal weight. She is not ill-appearing.  HENT:     Head: Normocephalic and atraumatic.  Eyes:     Pupils: Pupils are equal, round, and reactive to light.  Cardiovascular:     Rate and Rhythm: Normal rate and regular rhythm.  Pulmonary:     Effort: Pulmonary effort is normal. No respiratory distress.  Neurological:     Mental Status: She is alert and oriented to person, place, and time.  Psychiatric:        Mood and Affect: Mood normal.        Behavior: Behavior normal.        Assessment/Plan: 1. Abnormal weight gain Start phentermine with 1 half-tablet daily for the first week  then increase to 1 whole tablet if desired.  - phentermine (ADIPEX-P) 37.5 MG tablet; Take 1 tablet (37.5 mg total) by mouth daily before breakfast.  Dispense: 30 tablet; Refill: 0  2. BMI 11.9-41.7,EYCXK Metabolic test done. Daily caloric intake should be 1384-1728 calories per day. Follow up in 4 weeks Obesity Counseling: Risk Assessment: An assessment of behavioral risk factors was made today and includes lack of exercise sedentary lifestyle, lack of portion control and poor dietary habits. The patient has been screened for diabetes and a metabolic test to determine basal metabolic rate has been performed.   Risk Modification Advice: The patient was counseled on portion control guidelines. Safe  recommendations on caloric restriction discussed with patient. Sample meals plans were provided. General guidelines on diet and lifestyle modifications were discussed at length. Introducing physical activity as tolerated and at the patient's ability level is recommended.  - Metabolic Test - phentermine (ADIPEX-P) 37.5 MG tablet; Take 1 tablet (37.5 mg total) by mouth daily before breakfast.  Dispense: 30 tablet; Refill: 0  3. Depression, recurrent (HCC) Decrease duloxetine to 20 mg daily and buspirone to 10 mg TID x1 week. Then decrease to 1 half-tablet of buspirone TID x1 week then stop buspirone. Duloxetine can then be changed to every other day or she can stop 20 mg duloxetine cold [place] at that point. Bupropion is a good alternative - DULoxetine (CYMBALTA) 20 MG capsule; Take 1 capsule (20 mg total) by mouth daily.  Dispense: 30 capsule; Refill: 0 - hydrOXYzine (ATARAX) 25 MG tablet; Take 1 tablet (25 mg total) by mouth 3 (three) times daily as needed.  Dispense: 90 tablet; Refill: 2   General Counseling: Shaira verbalizes understanding of the findings of todays visit and agrees with plan of treatment. I have discussed any further diagnostic evaluation that may be needed or ordered today. We also  reviewed her medications today. she has been encouraged to call the office with any questions or concerns that should arise related to todays visit.    Orders Placed This Encounter  Procedures   Metabolic Test    Meds ordered this encounter  Medications   DULoxetine (CYMBALTA) 20 MG capsule    Sig: Take 1 capsule (20 mg total) by mouth daily.    Dispense:  30 capsule    Refill:  0   hydrOXYzine (ATARAX) 25 MG tablet    Sig: Take 1 tablet (25 mg total) by mouth 3 (three) times daily as needed.    Dispense:  90 tablet    Refill:  2   phentermine (ADIPEX-P) 37.5 MG tablet    Sig: Take 1 tablet (37.5 mg total) by mouth daily before breakfast.    Dispense:  30 tablet    Refill:  0    Will bring goodrx coupon, do not run through insurance.    Return in about 4 weeks (around 11/13/2022) for F/U, Weight loss, Olive Motyka PCP.   Total time spent:30 Minutes Time spent includes review of chart, medications, test results, and follow up plan with the patient.   [place] Controlled Substance Database was reviewed by me.  This patient was seen by Jonetta Osgood, FNP-C in collaboration with Dr. Clayborn Bigness as a part of collaborative care agreement.   Icey Tello R. Valetta Fuller, MSN, FNP-C Internal medicine

## 2022-10-28 ENCOUNTER — Other Ambulatory Visit: Payer: Self-pay | Admitting: Nurse Practitioner

## 2022-11-01 ENCOUNTER — Other Ambulatory Visit: Payer: Self-pay | Admitting: Nurse Practitioner

## 2022-11-07 ENCOUNTER — Other Ambulatory Visit: Payer: Self-pay | Admitting: Nurse Practitioner

## 2022-11-07 DIAGNOSIS — F339 Major depressive disorder, recurrent, unspecified: Secondary | ICD-10-CM

## 2022-11-15 ENCOUNTER — Encounter: Payer: Self-pay | Admitting: Nurse Practitioner

## 2022-11-15 ENCOUNTER — Ambulatory Visit (INDEPENDENT_AMBULATORY_CARE_PROVIDER_SITE_OTHER): Payer: Managed Care, Other (non HMO) | Admitting: Nurse Practitioner

## 2022-11-15 VITALS — BP 118/84 | HR 98 | Temp 97.8°F | Resp 16 | Ht 62.0 in | Wt 150.0 lb

## 2022-11-15 DIAGNOSIS — F419 Anxiety disorder, unspecified: Secondary | ICD-10-CM | POA: Diagnosis not present

## 2022-11-15 DIAGNOSIS — R635 Abnormal weight gain: Secondary | ICD-10-CM

## 2022-11-15 DIAGNOSIS — Z6829 Body mass index (BMI) 29.0-29.9, adult: Secondary | ICD-10-CM | POA: Diagnosis not present

## 2022-11-15 MED ORDER — PHENTERMINE HCL 37.5 MG PO TABS: 37.5000 mg | ORAL_TABLET | Freq: Every day | ORAL | 0 refills | Status: DC

## 2022-11-15 NOTE — Progress Notes (Signed)
[facility] [place], [place] [address]  Internal MEDICINE  Office Visit Note  Patient Name: Laura Morton  [id number]  [id number]  Date of Service: 11/15/2022  Chief Complaint  Patient presents with   Follow-up    Weight loss     HPI Krisanne presents for a follow up visit for weight loss management and abnormal weight gain.  --lost 13 lbs in the past month, wants to continue phentermine for another month.  --continues to do her regular workouts and diet. She is also logging all of her meals and workouts to keep track of them.  Not taking anxiety med, feeling good but did have 1 panic attack but managed it with hydroxyzine. She had a situation where she thought her dog was not breathing but her dog ended up being ok.     Current Medication: Outpatient Encounter Medications as of 11/15/2022  Medication Sig   albuterol (VENTOLIN HFA) 108 (90 Base) MCG/ACT inhaler Inhale 2 puffs into the lungs every 6 (six) hours as needed for wheezing or shortness of breath.   ANORO ELLIPTA 62.5-25 MCG/ACT AEPB INHALE 1 PUFF BY MOUTH EVERY DAY   busPIRone (BUSPAR) 10 MG tablet Take 2 tablets (20 mg total) by mouth 3 (three) times daily.   DULoxetine (CYMBALTA) 20 MG capsule Take 1 capsule (20 mg total) by mouth daily.   Ferrous Sulfate (IRON PO) Take by mouth. Pt takes the slow release   fluconazole (DIFLUCAN) 150 MG tablet Take 1 tablet once monthly by mouth at onset of symptom prior to start of cycle.   hydrOXYzine (ATARAX) 25 MG tablet Take 1 tablet (25 mg total) by mouth 3 (three) times daily as needed.   Multiple Vitamin (MULTIVITAMIN) tablet Take 1 tablet by mouth daily.   Spacer/Aero-Holding Chambers (AEROCHAMBER PLUS) inhaler Use as instructed   Tdap (BOOSTRIX) 5-2.5-18.5 LF-MCG/0.5 injection Inject 0.5 mLs into the muscle once.   [DISCONTINUED] phentermine (ADIPEX-P) 37.5 MG tablet Take 1 tablet (37.5 mg total) by mouth daily before breakfast.   phentermine (ADIPEX-P)  37.5 MG tablet Take 1 tablet (37.5 mg total) by mouth daily before breakfast.   No facility-administered encounter medications on file as of 11/15/2022.    Surgical History: Past Surgical History:  Procedure Laterality Date   BREAST BIOPSY Left 09/27/2022   US biopsy/ coil clip/ path pending   BREAST BIOPSY Right 09/27/2022   US biopsy/ heart clip/ path pending   BREAST BIOPSY Right 09/27/2022   [place] RT BREAST BX W LOC DEV 1ST LESION IMG BX SPEC US GUIDE 09/27/2022 [facility]   BREAST BIOPSY Left 09/27/2022   [place] LT BREAST BX W LOC DEV 1ST LESION IMG BX SPEC US GUIDE 09/27/2022 [facility]    Medical History: Past Medical History:  Diagnosis Date   Asthma    Environmental allergies    Knee fracture, left    Vertigo     Family History: Family History  Problem Relation Age of Onset   Diabetes Mother    Lupus Mother    Hypertension Mother    Hypertension Father    Heart disease Sister    Spina bifida Sister     Social History   Socioeconomic History   Marital status: Significant Other    Spouse name: Not on file   Number of children: Not on file   Years of education: Not on file   Highest education level: Not on file  Occupational History   Not on file  Tobacco Use  Smoking status: Never   Smokeless tobacco: Never  Vaping Use   Vaping Use: Never used  Substance and Sexual Activity   Alcohol use: Yes    Comment: on occasion   Drug use: No   Sexual activity: Yes    Partners: Male    Birth control/protection: Pill  Other Topics Concern   Not on file  Social History Narrative   Not on file   Social Determinants of Health   Financial Resource Strain: Not on file  Food Insecurity: Not on file  Transportation Needs: Not on file  Physical Activity: Not on file  Stress: Not on file  Social Connections: Not on file  Intimate Partner Violence: Not on file      Review of Systems  Constitutional:  Positive for fatigue and unexpected weight change.  Negative for chills.  HENT:  Negative for congestion, postnasal drip, rhinorrhea, sneezing and sore throat.   Eyes:  Negative for redness.  Respiratory:  Negative for cough, chest tightness, shortness of breath and wheezing.   Cardiovascular:  Negative for chest pain and palpitations.  Gastrointestinal:  Negative for abdominal pain, constipation, diarrhea, nausea and vomiting.  Genitourinary:  Negative for dysuria and frequency.  Musculoskeletal:  Positive for myalgias. Negative for arthralgias, back pain, joint swelling and neck pain.  Skin:  Negative for rash.  Neurological: Negative.  Negative for tremors and numbness.  Hematological:  Negative for adenopathy. Does not bruise/bleed easily.  Psychiatric/Behavioral:  Negative for behavioral problems (Depression), sleep disturbance and suicidal ideas. The patient is not nervous/anxious.     Vital Signs: BP 118/84   Pulse 98   Temp 97.8 F (36.6 C)   Resp 16   Ht '5\' 2"'$  (1.575 m)   Wt 150 lb (68 kg)   SpO2 99%   BMI 27.44 kg/m    Physical Exam Vitals reviewed.  Constitutional:      General: She is not in acute distress.    Appearance: Normal appearance. She is normal weight. She is not ill-appearing.  HENT:     Head: Normocephalic and atraumatic.  Eyes:     Pupils: Pupils are equal, round, and reactive to light.  Cardiovascular:     Rate and Rhythm: Normal rate and regular rhythm.  Pulmonary:     Effort: Pulmonary effort is normal. No respiratory distress.  Neurological:     Mental Status: She is alert and oriented to person, place, and time.  Psychiatric:        Mood and Affect: Mood normal.        Behavior: Behavior normal.        Assessment/Plan: 1. Abnormal weight gain Continue phentermine as prescribed x1 month, follow up visit in 4 weeks for weigh-in - phentermine (ADIPEX-P) 37.5 MG tablet; Take 1 tablet (37.5 mg total) by mouth daily before breakfast.  Dispense: 30 tablet; Refill: 0  2. BMI  29.0-29.9,adult Continue phentermine, follow up in 4 weeks - phentermine (ADIPEX-P) 37.5 MG tablet; Take 1 tablet (37.5 mg total) by mouth daily before breakfast.  Dispense: 30 tablet; Refill: 0  3. Anxiety Continue hydroxyzine as needed. Duloxetine and buspirone are discontinued. Patient remains stable at this time.    General Counseling: anslee micheletti understanding of the findings of todays visit and agrees with plan of treatment. I have discussed any further diagnostic evaluation that may be needed or ordered today. We also reviewed her medications today. she has been encouraged to call the office with any questions or concerns that should arise  related to todays visit.    No orders of the defined types were placed in this encounter.   Meds ordered this encounter  Medications   phentermine (ADIPEX-P) 37.5 MG tablet    Sig: Take 1 tablet (37.5 mg total) by mouth daily before breakfast.    Dispense:  30 tablet    Refill:  0    Will bring goodrx coupon, do not run through insurance.    Return in about 4 weeks (around 12/13/2022) for F/U, Weight loss, Fayette Hamada PCP.   Total time spent:30 Minutes Time spent includes review of chart, medications, test results, and follow up plan with the patient.    Controlled Substance Database was reviewed by me.  This patient was seen by Jonetta Osgood, FNP-C in collaboration with Dr. Clayborn Bigness as a part of collaborative care agreement.   Annora Guderian R. Valetta Fuller, MSN, FNP-C Internal medicine

## 2022-12-13 ENCOUNTER — Other Ambulatory Visit
Admission: RE | Admit: 2022-12-13 | Discharge: 2022-12-13 | Disposition: A | Discharge status: Home / Self Care | Payer: Managed Care, Other (non HMO) | Attending: Nurse Practitioner | Admitting: Nurse Practitioner

## 2022-12-13 ENCOUNTER — Encounter: Payer: Self-pay | Admitting: Nurse Practitioner

## 2022-12-13 ENCOUNTER — Ambulatory Visit (INDEPENDENT_AMBULATORY_CARE_PROVIDER_SITE_OTHER): Payer: Managed Care, Other (non HMO) | Admitting: Nurse Practitioner

## 2022-12-13 VITALS — BP 131/82 | HR 77 | Temp 98.3°F | Resp 16 | Ht 62.0 in | Wt 143.6 lb

## 2022-12-13 DIAGNOSIS — D508 Other iron deficiency anemias: Secondary | ICD-10-CM | POA: Insufficient documentation

## 2022-12-13 DIAGNOSIS — F419 Anxiety disorder, unspecified: Secondary | ICD-10-CM

## 2022-12-13 DIAGNOSIS — Z6829 Body mass index (BMI) 29.0-29.9, adult: Secondary | ICD-10-CM | POA: Diagnosis not present

## 2022-12-13 DIAGNOSIS — R635 Abnormal weight gain: Secondary | ICD-10-CM | POA: Diagnosis not present

## 2022-12-13 LAB — CBC WITH DIFFERENTIAL/PLATELET
Abs Immature Granulocytes: 0.01 10*3/uL (ref 0.00–0.07)
Basophils Absolute: 0 10*3/uL (ref 0.0–0.1)
Basophils Relative: 1 %
Eosinophils Absolute: 0.2 10*3/uL (ref 0.0–0.5)
Eosinophils Relative: 4 %
HCT: 44 % (ref 36.0–46.0)
Hemoglobin: 14.1 g/dL (ref 12.0–15.0)
Immature Granulocytes: 0 %
Lymphocytes Relative: 33 %
Lymphs Abs: 1.3 10*3/uL (ref 0.7–4.0)
MCH: 28.4 pg (ref 26.0–34.0)
MCHC: 32 g/dL (ref 30.0–36.0)
MCV: 88.5 fL (ref 80.0–100.0)
Monocytes Absolute: 0.4 10*3/uL (ref 0.1–1.0)
Monocytes Relative: 9 %
Neutro Abs: 2.2 10*3/uL (ref 1.7–7.7)
Neutrophils Relative %: 53 %
Platelets: 294 10*3/uL (ref 150–400)
RBC: 4.97 MIL/uL (ref 3.87–5.11)
RDW: 12.8 % (ref 11.5–15.5)
WBC: 4 10*3/uL (ref 4.0–10.5)
nRBC: 0 % (ref 0.0–0.2)

## 2022-12-13 LAB — IRON AND TIBC
Iron: 56 ug/dL (ref 28–170)
Saturation Ratios: 17 % (ref 10.4–31.8)
TIBC: 335 ug/dL (ref 250–450)
UIBC: 279 ug/dL

## 2022-12-13 MED ORDER — PHENTERMINE HCL 37.5 MG PO TABS: 37.5000 mg | ORAL_TABLET | Freq: Every day | ORAL | 0 refills | Status: DC

## 2022-12-13 NOTE — Progress Notes (Signed)
[facility] [place], [place] [address]  Internal MEDICINE  Office Visit Note  Patient Name: Laura Morton  [id number]  [id number]  Date of Service: 12/13/2022  Chief Complaint  Patient presents with   Follow-up    Weight loss     HPI Jolisa presents for a follow-up visit for weight loss management, anemia, anxiety  Weight loss management -- lost another 7 lbs since her last office visit. Continues to take phentermine continues to do her regular workouts and diet. She is also logging all of her meals and workouts to keep track of them.   Financial hardships and working through with significant other. And was promoted to a new job at work.  Iron deficiency anemia, need to recheck labs for upcoming appointment next week.      Current Medication: Outpatient Encounter Medications as of 12/13/2022  Medication Sig   albuterol (VENTOLIN HFA) 108 (90 Base) MCG/ACT inhaler Inhale 2 puffs into the lungs every 6 (six) hours as needed for wheezing or shortness of breath.   ANORO ELLIPTA 62.5-25 MCG/ACT AEPB INHALE 1 PUFF BY MOUTH EVERY DAY   busPIRone (BUSPAR) 10 MG tablet Take 2 tablets (20 mg total) by mouth 3 (three) times daily.   DULoxetine (CYMBALTA) 20 MG capsule Take 1 capsule (20 mg total) by mouth daily.   Ferrous Sulfate (IRON PO) Take by mouth. Pt takes the slow release   fluconazole (DIFLUCAN) 150 MG tablet Take 1 tablet once monthly by mouth at onset of symptom prior to start of cycle.   hydrOXYzine (ATARAX) 25 MG tablet Take 1 tablet (25 mg total) by mouth 3 (three) times daily as needed.   Multiple Vitamin (MULTIVITAMIN) tablet Take 1 tablet by mouth daily.   Spacer/Aero-Holding Chambers (AEROCHAMBER PLUS) inhaler Use as instructed   Tdap (BOOSTRIX) 5-2.5-18.5 LF-MCG/0.5 injection Inject 0.5 mLs into the muscle once.   [DISCONTINUED] phentermine (ADIPEX-P) 37.5 MG tablet Take 1 tablet (37.5 mg total) by mouth daily before breakfast.   phentermine  (ADIPEX-P) 37.5 MG tablet Take 1 tablet (37.5 mg total) by mouth daily before breakfast.   No facility-administered encounter medications on file as of 12/13/2022.    Surgical History: Past Surgical History:  Procedure Laterality Date   BREAST BIOPSY Left 09/27/2022   US biopsy/ coil clip/ path pending   BREAST BIOPSY Right 09/27/2022   US biopsy/ heart clip/ path pending   BREAST BIOPSY Right 09/27/2022   [place] RT BREAST BX W LOC DEV 1ST LESION IMG BX SPEC US GUIDE 09/27/2022 [facility]   BREAST BIOPSY Left 09/27/2022   [place] LT BREAST BX W LOC DEV 1ST LESION IMG BX SPEC US GUIDE 09/27/2022 [facility]    Medical History: Past Medical History:  Diagnosis Date   Asthma    Environmental allergies    Knee fracture, left    Vertigo     Family History: Family History  Problem Relation Age of Onset   Diabetes Mother    Lupus Mother    Hypertension Mother    Hypertension Father    Heart disease Sister    Spina bifida Sister     Social History   Socioeconomic History   Marital status: Significant Other    Spouse name: Not on file   Number of children: Not on file   Years of education: Not on file   Highest education level: Not on file  Occupational History   Not on file  Tobacco Use   Smoking status: Never  Smokeless tobacco: Never  Vaping Use   Vaping Use: Never used  Substance and Sexual Activity   Alcohol use: Yes    Comment: on occasion   Drug use: No   Sexual activity: Yes    Partners: Male    Birth control/protection: Pill  Other Topics Concern   Not on file  Social History Narrative   Not on file   Social Determinants of Health   Financial Resource Strain: Not on file  Food Insecurity: Not on file  Transportation Needs: Not on file  Physical Activity: Not on file  Stress: Not on file  Social Connections: Not on file  Intimate Partner Violence: Not on file      Review of Systems  Constitutional:  Positive for fatigue and unexpected  weight change. Negative for chills.  HENT:  Negative for congestion, postnasal drip, rhinorrhea, sneezing and sore throat.   Eyes:  Negative for redness.  Respiratory:  Negative for cough, chest tightness, shortness of breath and wheezing.   Cardiovascular:  Negative for chest pain and palpitations.  Gastrointestinal:  Negative for abdominal pain, constipation, diarrhea, nausea and vomiting.  Genitourinary:  Negative for dysuria and frequency.  Musculoskeletal:  Positive for myalgias. Negative for arthralgias, back pain, joint swelling and neck pain.  Skin:  Negative for rash.  Neurological: Negative.  Negative for tremors and numbness.  Hematological:  Negative for adenopathy. Does not bruise/bleed easily.  Psychiatric/Behavioral:  Negative for behavioral problems (Depression), sleep disturbance and suicidal ideas. The patient is not nervous/anxious.     Vital Signs: BP 131/82   Pulse 77   Temp 98.3 F (36.8 C)   Resp 16   Ht '5\' 2"'$  (1.575 m)   Wt 143 lb 9.6 oz (65.1 kg)   SpO2 98%   BMI 26.26 kg/m    Physical Exam Vitals reviewed.  Constitutional:      General: She is not in acute distress.    Appearance: Normal appearance. She is normal weight. She is not ill-appearing.  HENT:     Head: Normocephalic and atraumatic.  Eyes:     Pupils: Pupils are equal, round, and reactive to light.  Cardiovascular:     Rate and Rhythm: Normal rate and regular rhythm.  Pulmonary:     Effort: Pulmonary effort is normal. No respiratory distress.  Neurological:     Mental Status: She is alert and oriented to person, place, and time.  Psychiatric:        Mood and Affect: Mood normal.        Behavior: Behavior normal.        Assessment/Plan: 1. Abnormal weight gain Lost another 7 lbs, for a total of 20 lbs lost. Continue phentermine for another month, follow up in 4 weeks.  - phentermine (ADIPEX-P) 37.5 MG tablet; Take 1 tablet (37.5 mg total) by mouth daily before breakfast.   Dispense: 30 tablet; Refill: 0  2. Other iron deficiency anemia Time to repeat labs to reassess anemia.  - CBC with Differential/Platelet - Iron, TIBC and Ferritin Panel  3. BMI 29.0-29.9,adult Continue phentermine for another month. Follow up in 4 weeks to reassess weight - phentermine (ADIPEX-P) 37.5 MG tablet; Take 1 tablet (37.5 mg total) by mouth daily before breakfast.  Dispense: 30 tablet; Refill: 0  4. Anxiety Has had increased anxiety related to work changes and financial hardship with significant other but is working through this with positive coping mechanisms and conflict resolution.    General Counseling: ajanae virag understanding of the  findings of todays visit and agrees with plan of treatment. I have discussed any further diagnostic evaluation that may be needed or ordered today. We also reviewed her medications today. she has been encouraged to call the office with any questions or concerns that should arise related to todays visit.    Orders Placed This Encounter  Procedures   CBC with Differential/Platelet   Iron, TIBC and Ferritin Panel    Meds ordered this encounter  Medications   phentermine (ADIPEX-P) 37.5 MG tablet    Sig: Take 1 tablet (37.5 mg total) by mouth daily before breakfast.    Dispense:  30 tablet    Refill:  0    Will bring goodrx coupon, do not run through insurance.    Return in about 4 weeks (around 01/10/2023) for F/U, Weight loss, Jasman Murri PCP and will keep appt on 2/6 as well.   Total time spent:30 Minutes Time spent includes review of chart, medications, test results, and follow up plan with the patient.   [place] Controlled Substance Database was reviewed by me.  This patient was seen by Jonetta Osgood, FNP-C in collaboration with Dr. Clayborn Bigness as a part of collaborative care agreement.   Forever Arechiga R. Valetta Fuller, MSN, FNP-C Internal medicine

## 2022-12-19 ENCOUNTER — Encounter: Payer: Self-pay | Admitting: Nurse Practitioner

## 2022-12-19 ENCOUNTER — Ambulatory Visit (INDEPENDENT_AMBULATORY_CARE_PROVIDER_SITE_OTHER): Payer: Managed Care, Other (non HMO) | Admitting: Nurse Practitioner

## 2022-12-19 VITALS — BP 135/88 | HR 95 | Temp 97.3°F | Resp 16 | Ht 62.0 in | Wt 146.6 lb

## 2022-12-19 DIAGNOSIS — D508 Other iron deficiency anemias: Secondary | ICD-10-CM

## 2022-12-19 NOTE — Progress Notes (Signed)
[facility] [place], [place] [address]  Internal MEDICINE  Office Visit Note  Patient Name: Laura Morton  [id number]  [id number]  Date of Service: 12/19/2022  Chief Complaint  Patient presents with   Follow-up    HPI Laura Morton presents for a follow-up visit for lab results  Anemia -- lab results iron panel and CBC normal. Taking OTC iron supplement.      Current Medication: Outpatient Encounter Medications as of 12/19/2022  Medication Sig   albuterol (VENTOLIN HFA) 108 (90 Base) MCG/ACT inhaler Inhale 2 puffs into the lungs every 6 (six) hours as needed for wheezing or shortness of breath.   ANORO ELLIPTA 62.5-25 MCG/ACT AEPB INHALE 1 PUFF BY MOUTH EVERY DAY   busPIRone (BUSPAR) 10 MG tablet Take 2 tablets (20 mg total) by mouth 3 (three) times daily.   DULoxetine (CYMBALTA) 20 MG capsule Take 1 capsule (20 mg total) by mouth daily.   Ferrous Sulfate (IRON PO) Take by mouth. Pt takes the slow release   fluconazole (DIFLUCAN) 150 MG tablet Take 1 tablet once monthly by mouth at onset of symptom prior to start of cycle.   hydrOXYzine (ATARAX) 25 MG tablet Take 1 tablet (25 mg total) by mouth 3 (three) times daily as needed.   Multiple Vitamin (MULTIVITAMIN) tablet Take 1 tablet by mouth daily.   phentermine (ADIPEX-P) 37.5 MG tablet Take 1 tablet (37.5 mg total) by mouth daily before breakfast.   Spacer/Aero-Holding Chambers (AEROCHAMBER PLUS) inhaler Use as instructed   Tdap (BOOSTRIX) 5-2.5-18.5 LF-MCG/0.5 injection Inject 0.5 mLs into the muscle once.   No facility-administered encounter medications on file as of 12/19/2022.    Surgical History: Past Surgical History:  Procedure Laterality Date   BREAST BIOPSY Left 09/27/2022   US biopsy/ coil clip/ path pending   BREAST BIOPSY Right 09/27/2022   US biopsy/ heart clip/ path pending   BREAST BIOPSY Right 09/27/2022   [place] RT BREAST BX W LOC DEV 1ST LESION IMG BX SPEC US GUIDE 09/27/2022 [facility]    BREAST BIOPSY Left 09/27/2022   [place] LT BREAST BX W LOC DEV 1ST LESION IMG BX [place] US GUIDE 09/27/2022 [facility]    Medical History: Past Medical History:  Diagnosis Date   Asthma    Environmental allergies    Knee fracture, left    Vertigo     Family History: Family History  Problem Relation Age of Onset   Diabetes Mother    Lupus Mother    Hypertension Mother    Hypertension Father    Heart disease Sister    Spina bifida Sister     Social History   Socioeconomic History   Marital status: Significant Other    Spouse name: Not on file   Number of children: Not on file   Years of education: Not on file   Highest education level: Not on file  Occupational History   Not on file  Tobacco Use   Smoking status: Never   Smokeless tobacco: Never  Vaping Use   Vaping Use: Never used  Substance and Sexual Activity   Alcohol use: Yes    Comment: on occasion   Drug use: No   Sexual activity: Yes    Partners: Male    Birth control/protection: Pill  Other Topics Concern   Not on file  Social History Narrative   Not on file   Social Determinants of Health   Financial Resource Strain: Not on file  Food Insecurity: Not on file  Transportation Needs: Not on file  Physical Activity: Not on file  Stress: Not on file  Social Connections: Not on file  Intimate Partner Violence: Not on file      Review of Systems  Constitutional:  Positive for fatigue and unexpected weight change. Negative for chills.  HENT:  Negative for congestion, postnasal drip, rhinorrhea, sneezing and sore throat.   Eyes:  Negative for redness.  Respiratory:  Negative for cough, chest tightness, shortness of breath and wheezing.   Cardiovascular:  Negative for chest pain and palpitations.  Gastrointestinal:  Negative for abdominal pain, constipation, diarrhea, nausea and vomiting.  Genitourinary:  Negative for dysuria and frequency.  Musculoskeletal:  Positive for myalgias. Negative for  arthralgias, back pain, joint swelling and neck pain.  Skin:  Negative for rash.  Neurological: Negative.  Negative for tremors and numbness.  Hematological:  Negative for adenopathy. Does not bruise/bleed easily.  Psychiatric/Behavioral:  Negative for behavioral problems (Depression), sleep disturbance and suicidal ideas. The patient is not nervous/anxious.     Vital Signs: BP 135/88   Pulse 95   Temp (!) 97.3 F (36.3 C)   Resp 16   Ht '5\' 2"'$  (1.575 m)   Wt 146 lb 9.6 oz (66.5 kg)   SpO2 99%   BMI 26.81 kg/m    Physical Exam Vitals reviewed.  Constitutional:      General: She is not in acute distress.    Appearance: Normal appearance. She is normal weight. She is not ill-appearing.  HENT:     Head: Normocephalic and atraumatic.  Eyes:     Pupils: Pupils are equal, round, and reactive to light.  Cardiovascular:     Rate and Rhythm: Normal rate and regular rhythm.  Pulmonary:     Effort: Pulmonary effort is normal. No respiratory distress.  Neurological:     Mental Status: She is alert and oriented to person, place, and time.  Psychiatric:        Mood and Affect: Mood normal.        Behavior: Behavior normal.        Assessment/Plan: 1. Other iron deficiency anemia Continue OTC iron supplement as discussed.    General Counseling: Laura Morton understanding of the findings of todays visit and agrees with plan of treatment. I have discussed any further diagnostic evaluation that may be needed or ordered today. We also reviewed her medications today. she has been encouraged to call the office with any questions or concerns that should arise related to todays visit.    No orders of the defined types were placed in this encounter.   No orders of the defined types were placed in this encounter.   Return for previously scheduled, F/U, Geoffery Aultman PCP for weight loss management.   Total time spent:20 Minutes Time spent includes review of chart, medications, test  results, and follow up plan with the patient.   [place] Controlled Substance Database was reviewed by me.  This patient was seen by Jonetta Osgood, FNP-C in collaboration with Dr. Clayborn Bigness as a part of collaborative care agreement.   [name] R. Valetta Fuller, MSN, FNP-C Internal medicine

## 2023-01-04 ENCOUNTER — Ambulatory Visit (INDEPENDENT_AMBULATORY_CARE_PROVIDER_SITE_OTHER): Payer: Managed Care, Other (non HMO) | Admitting: Dermatology

## 2023-01-04 VITALS — BP 137/78

## 2023-01-04 DIAGNOSIS — Z7189 Other specified counseling: Secondary | ICD-10-CM

## 2023-01-04 DIAGNOSIS — L639 Alopecia areata, unspecified: Secondary | ICD-10-CM | POA: Diagnosis not present

## 2023-01-04 DIAGNOSIS — D225 Melanocytic nevi of trunk: Secondary | ICD-10-CM | POA: Diagnosis not present

## 2023-01-04 NOTE — Progress Notes (Signed)
   New Patient Visit  Subjective  Laura Morton is a 37 y.o. female who presents for the following: New Patient (Initial Visit) (Patient here concerning bald spots she noticed 4 months ago to areas behind ears. She states that hair has started growing back in the areas. She also reports 2 moles she would like to have checked at abdomen and lower abdomen near groin. ).  The following portions of the chart were reviewed this encounter and updated as appropriate:   Tobacco  Allergies  Meds  Problems  Med Hx  Surg Hx  Fam Hx     Review of Systems:  No other skin or systemic complaints except as noted in HPI or Assessment and Plan.  Objective  Well appearing patient in no apparent distress; mood and affect are within normal limits.  A focused examination was performed including scalp, abdomen. Relevant physical exam findings are noted in the Assessment and Plan.  Scalp Clear at exam   Assessment & Plan  Alopecia areata Resolving on its own. Scalp  Alopecia areata is a chronic autoimmune condition localized to the skin which affects hair follicles and causes hair loss, most commonly in the scalp.  Cause is unknown.  Can be unpredictable, difficult to treat, and may recur.  Treatments may include topical and intralesional steroids to decrease inflammation to allow for hair regrowth.  Other treatments may include narrowband ultraviolet B light treatment; topical Squairic acid immunotherapy application; topical or oral Minoxidil; antihistamines and oral Jak inhibitors.  Do not recommend any treatment at this time.   Melanocytic Nevi Abdomen and lower abdomen - Tan-brown and/or pink-flesh-colored symmetric macules and papules - Benign appearing on exam today - Observation - Call clinic for new or changing moles - Recommend daily use of broad spectrum spf 30+ sunscreen to sun-exposed areas.   Return if symptoms worsen or fail to improve.  IRuthell Rummage, CMA, am acting as scribe for  Sarina Ser, MD. Documentation: I have reviewed the above documentation for accuracy and completeness, and I agree with the above.  Sarina Ser, MD

## 2023-01-04 NOTE — Patient Instructions (Addendum)
Alopecia areata is a chronic autoimmune condition localized to the skin which affects hair follicles and causes hair loss, most commonly in the scalp.  Cause is unknown.  Can be unpredictable, difficult to treat, and may recur.  Treatments may include topical and intralesional steroids to decrease inflammation to allow for hair regrowth.  Other treatments may include narrowband ultraviolet B light treatment; topical Squairic acid immunotherapy application; topical or oral Minoxidil; antihistamines and oral Jak inhibitors.  Do not recommend treatment at this time because hair is growing back   Alopecia Areata, Adult  Alopecia areata is a condition that causes hair loss. A person with this condition may lose hair on the scalp in patches. In some cases, a person may lose all the hair on the scalp or all the hair from the face and body. Having this condition can be emotionally difficult, but it is not dangerous. Alopecia areata is an autoimmune disease. This means that your body's defense system (immune system) mistakes normal parts of the body for germs or other things that can make you sick. When you have alopecia areata, the immune system attacks the hair follicles. What are the causes? The cause of this condition is not known. What increases the risk? You are more likely to develop this condition if you have: A family history of alopecia. A family history of another autoimmune disease, including type 1 diabetes and thyroid autoimmune disease. Eczema, asthma, and allergies. Down syndrome. What are the signs or symptoms? The main symptom of this condition is round spots of patchy hair loss on the scalp. The spots may be mildly itchy. Other symptoms include: Short dark hairs in the bald patches that are wider at the top (exclamation point hairs). Dents, white spots, or lines in the fingernails or toenails. Balding and body hair loss. This is rare. Alopecia areata usually develops in childhood,  but it can develop at any age. For some people, their hair grows back on its own and hair loss does not happen again. For others, their hair may fall out and grow back in cycles. The hair loss may last many years. How is this diagnosed? This condition is diagnosed based on your symptoms and family history. Your health care provider will also check your scalp skin, teeth, and nails. Your health care provider may refer you to a specialist in hair and skin disorders (dermatologist). You may also have tests, including: A hair pull test. Blood tests or other screening tests to check for autoimmune diseases, such as thyroid disease or diabetes. Skin biopsy to confirm the diagnosis. A procedure to examine the skin with a lighted magnifying instrument (dermoscopy). How is this treated? There is no cure for alopecia areata. The goals of treatment are to promote the regrowth of hair and prevent the immune system from overreacting. No single treatment is right for all people with alopecia areata. It depends on the type of hair loss you have and how severe it is. Work with your health care provider to find the best treatment for you. Treatment may include: Regular checkups to make sure the condition is not getting worse . This is called watchful waiting. Using steroid creams or pills for 6-8 weeks to stop the immune reaction and help hair to regrow more quickly. Using other medicines on your skin (topical medicines) to change the immune system response and support the hair growth cycle. Steroid injections. Therapy and counseling with a support group or therapist if you are having trouble coping with  hair loss. Follow these instructions at home: Medicines Apply topical creams only as told by your health care provider. Take over-the-counter and prescription medicines only as told by your health care provider. General instructions Learn as much as you can about your condition. Consider getting a wig or  products to make hair look fuller or to cover bald spots, if you feel uncomfortable with your appearance. Get therapy or counseling if you are having a hard time coping with hair loss. Ask your health care provider to recommend a counselor or support group. Keep all follow-up visits as told by your health care provider. This is important. Where to find more information [employer] [place] Contact a health care provider if: Your hair loss gets worse, even with treatment. You have new symptoms. You are struggling emotionally. Get help right away if: You have a sudden worsening of the hair loss. Summary Alopecia areata is an autoimmune condition that makes your body's defense system (immune system) attack the hair follicles. This causes you to lose hair. Having this condition can be emotionally difficult, but it is not dangerous. Treatments may include regular checkups to make sure that the condition is not getting worse, medicines, and steroid injections. This information is not intended to replace advice given to you by your health care provider. Make sure you discuss any questions you have with your health care provider. Document Revised: 01/13/2020 Document Reviewed: 01/13/2020 Elsevier Patient Education  [place] is the most dangerous type of skin cancer, and is the leading cause of death from skin disease.  You are more likely to develop melanoma if you: Have light-colored skin, light-colored eyes, or red or blond hair Spend a lot of time in the sun Tan regularly, either outdoors or in a tanning bed Have had blistering sunburns, especially during childhood Have a close family member who has had a melanoma Have atypical moles or large birthmarks  Early detection of melanoma is key since treatment is typically straightforward and cure rates are extremely high if we catch it early.   The first sign of melanoma is  often a change in a mole or a new dark spot.  The ABCDE system is a way of remembering the signs of melanoma.  A for asymmetry:  The two halves do not match. B for border:  The edges of the growth are irregular. C for color:  A mixture of colors are present instead of an even brown color. D for diameter:  Melanomas are usually (but not always) greater than 101m - the size of a pencil eraser. E for evolution:  The spot keeps changing in size, shape, and color.  Please check your skin once per month between visits. You can use a small mirror in front and a large mirror behind you to keep an eye on the back side or your body.   If you see any new or changing lesions before your next follow-up, please call to schedule a visit.  Please continue daily skin protection including broad spectrum sunscreen SPF 30+ to sun-exposed areas, reapplying every 2 hours as needed when you're outdoors.   Staying in the shade or wearing long sleeves, sun glasses (UVA+UVB protection) and wide brim hats (4-inch brim around the entire circumference of the hat) are also recommended for sun protection.         Due to recent changes in healthcare laws, you may see results of your pathology and/or  laboratory studies on MyChart before the doctors have had a chance to review them. We understand that in some cases there may be results that are confusing or concerning to you. Please understand that not all results are received at the same time and often the doctors may need to interpret multiple results in order to provide you with the best plan of care or course of treatment. Therefore, we ask that you please give [place] 2 business days to thoroughly review all your results before contacting the office for clarification. Should we see a critical lab result, you will be contacted sooner.   If You Need Anything After Your Visit  If you have any questions or concerns for your doctor, please call our main line at [phone] and  press option 4 to reach your doctor's medical assistant. If no one answers, please leave a voicemail as directed and we will return your call as soon as possible. Messages left after 4 pm will be answered the following business day.   You may also send [place] a message via [place]. We typically respond to MyChart messages within 1-2 business days.  For prescription refills, please ask your pharmacy to contact our office. Our fax number is [phone].  If you have an urgent issue when the clinic is closed that cannot wait until the next business day, you can page your doctor at the number below.    Please note that while we do our best to be available for urgent issues outside of office hours, we are not available 24/7.   If you have an urgent issue and are unable to reach [place], you may choose to seek medical care at your doctor's office, retail clinic, urgent care center, or emergency room.  If you have a medical emergency, please immediately call 911 or go to the emergency department.  Pager Numbers  - Dr. Nehemiah Massed: [phone]  - Dr. Laurence Ferrari: [phone]  - Dr. Nicole Kindred: [phone]  In the event of inclement weather, please call our main line at [phone] for an update on the status of any delays or closures.  Dermatology Medication Tips: Please keep the boxes that topical medications come in in order to help keep track of the instructions about where and how to use these. Pharmacies typically print the medication instructions only on the boxes and not directly on the medication tubes.   If your medication is too expensive, please contact our office at [phone] option 4 or send [place] a message through [place].   We are unable to tell what your co-pay for medications will be in advance as this is different depending on your insurance coverage. However, we may be able to find a substitute medication at lower cost or fill out paperwork to get insurance to cover a needed medication.   If  a prior authorization is required to get your medication covered by your insurance company, please allow [place] 1-2 business days to complete this process.  Drug prices often vary depending on where the prescription is filled and some pharmacies may offer cheaper prices.  The website www.goodrx.com contains coupons for medications through different pharmacies. The prices here do not account for what the cost may be with help from insurance (it may be cheaper with your insurance), but the website can give you the price if you did not use any insurance.  - You can print the associated coupon and take it with your prescription to the pharmacy.  - You may also stop by our office  during regular business hours and pick up a GoodRx coupon card.  - If you need your prescription sent electronically to a different pharmacy, notify our office through [facility] or by phone at [phone] option 4.     Si Usted Necesita Algo Despus de Su Visita  Tambin puede enviarnos un mensaje a travs de Pharmacist, community. Por lo general respondemos a los mensajes de MyChart en el transcurso de 1 a 2 das hbiles.  Para renovar recetas, por favor pida a su farmacia que se ponga en contacto con nuestra oficina. Harland Dingwall de fax es [place] [phone].  Si tiene un asunto urgente cuando la clnica est cerrada y que no puede esperar hasta el siguiente da hbil, puede llamar/localizar a su doctor(a) al nmero que aparece a continuacin.   Por favor, tenga en cuenta que aunque hacemos todo lo posible para estar disponibles para asuntos urgentes fuera del horario de [place], no estamos disponibles las 24 horas del da, los 7 das de la [place].   Si tiene un problema urgente y no puede comunicarse con nosotros, puede optar por buscar atencin mdica  en el consultorio de su doctor(a), en una clnica privada, en un centro de atencin urgente o en una sala de emergencias.  Si tiene Engineering geologist, por favor llame  inmediatamente al 911 o vaya a la sala de emergencias.  Nmeros de bper  - Dr. Nehemiah Massed: [phone]  - Dra. Moye: [phone]  - Dra. Nicole Kindred: [phone]  En caso de inclemencias del [place], por favor llame a Johnsie Kindred principal al [phone] para una actualizacin sobre el [place] de cualquier retraso o cierre.  Consejos para la medicacin en dermatologa: Por favor, guarde las cajas en las que vienen los medicamentos de uso tpico para ayudarle a seguir las instrucciones sobre dnde y cmo usarlos. Las farmacias generalmente imprimen las instrucciones del medicamento slo en las cajas y no directamente en los tubos del [place].   Si su medicamento es muy caro, por favor, pngase en contacto con Zigmund Daniel llamando al [phone] y presione la opcin 4 o envenos un mensaje a travs de Pharmacist, community.   No podemos decirle cul ser su copago por los medicamentos por adelantado ya que esto es diferente dependiendo de la cobertura de su seguro. Sin embargo, es posible que podamos encontrar un medicamento sustituto a Electrical engineer un formulario para que el seguro cubra el medicamento que se considera necesario.   Si se requiere una autorizacin previa para que su compaa de seguros [place] su medicamento, por favor permtanos de 1 a 2 das hbiles para completar este proceso.  Los precios de los medicamentos varan con frecuencia dependiendo del Environmental consultant de dnde se surte la receta y alguna farmacias pueden ofrecer precios ms baratos.  El sitio web www.goodrx.com tiene cupones para medicamentos de Airline pilot. Los precios aqu no tienen en cuenta lo que podra costar con la ayuda del seguro (puede ser ms barato con su seguro), pero el sitio web puede darle el precio si no utiliz Research scientist (physical sciences).  - Puede imprimir el cupn correspondiente y llevarlo con su receta a la farmacia.  - Tambin puede pasar por nuestra oficina durante el horario de atencin regular y Charity fundraiser  una tarjeta de cupones de GoodRx.  - Si necesita que su receta se enve electrnicamente a una farmacia diferente, informe a nuestra oficina a travs de MyChart de  o por telfono llamando al [phone] y presione la opcin 4.

## 2023-01-10 ENCOUNTER — Ambulatory Visit: Payer: Managed Care, Other (non HMO) | Admitting: Nurse Practitioner

## 2023-01-11 ENCOUNTER — Ambulatory Visit (INDEPENDENT_AMBULATORY_CARE_PROVIDER_SITE_OTHER): Payer: Managed Care, Other (non HMO) | Admitting: Nurse Practitioner

## 2023-01-11 ENCOUNTER — Encounter: Payer: Self-pay | Admitting: Nurse Practitioner

## 2023-01-11 VITALS — BP 146/95 | Temp 97.8°F | Resp 16 | Ht 62.0 in | Wt 138.4 lb

## 2023-01-11 DIAGNOSIS — Z6825 Body mass index (BMI) 25.0-25.9, adult: Secondary | ICD-10-CM | POA: Diagnosis not present

## 2023-01-11 DIAGNOSIS — F419 Anxiety disorder, unspecified: Secondary | ICD-10-CM | POA: Diagnosis not present

## 2023-01-11 DIAGNOSIS — R635 Abnormal weight gain: Secondary | ICD-10-CM | POA: Diagnosis not present

## 2023-01-11 DIAGNOSIS — E663 Overweight: Secondary | ICD-10-CM | POA: Diagnosis not present

## 2023-01-11 MED ORDER — PHENTERMINE HCL 37.5 MG PO TABS: 37.5000 mg | ORAL_TABLET | Freq: Every day | ORAL | 0 refills | Status: DC

## 2023-01-11 NOTE — Progress Notes (Signed)
[facility] [place], [place] [address]  Internal MEDICINE  Office Visit Note  Patient Name: Laura Morton  [id number]  [id number]  Date of Service: 01/11/2023  Chief Complaint  Patient presents with   Follow-up    Weight loss     HPI Laura Morton presents for a follow-up visit for weight loss management, anemia, anxiety  Weight loss management -- lost another 8 lbs since her last office visit. Continues to take phentermine continues to do her regular workouts and diet. She is also logging all of her meals and workouts to keep track of them.    new job at work. going great, enjoys what she does.  Iron deficiency anemia resolved for now Anxiety -- stable     Current Medication: Outpatient Encounter Medications as of 01/11/2023  Medication Sig   albuterol (VENTOLIN HFA) 108 (90 Base) MCG/ACT inhaler Inhale 2 puffs into the lungs every 6 (six) hours as needed for wheezing or shortness of breath.   ANORO ELLIPTA 62.5-25 MCG/ACT AEPB INHALE 1 PUFF BY MOUTH EVERY DAY   busPIRone (BUSPAR) 10 MG tablet Take 2 tablets (20 mg total) by mouth 3 (three) times daily.   DULoxetine (CYMBALTA) 20 MG capsule Take 1 capsule (20 mg total) by mouth daily.   Ferrous Sulfate (IRON PO) Take by mouth. Pt takes the slow release   fluconazole (DIFLUCAN) 150 MG tablet Take 1 tablet once monthly by mouth at onset of symptom prior to start of cycle.   hydrOXYzine (ATARAX) 25 MG tablet Take 1 tablet (25 mg total) by mouth 3 (three) times daily as needed.   Multiple Vitamin (MULTIVITAMIN) tablet Take 1 tablet by mouth daily.   Spacer/Aero-Holding Chambers (AEROCHAMBER PLUS) inhaler Use as instructed   Tdap (BOOSTRIX) 5-2.5-18.5 LF-MCG/0.5 injection Inject 0.5 mLs into the muscle once.   [DISCONTINUED] phentermine (ADIPEX-P) 37.5 MG tablet Take 1 tablet (37.5 mg total) by mouth daily before breakfast.   phentermine (ADIPEX-P) 37.5 MG tablet Take 1 tablet (37.5 mg total) by mouth daily before  breakfast.   [START ON 02/08/2023] phentermine (ADIPEX-P) 37.5 MG tablet Take 1 tablet (37.5 mg total) by mouth daily before breakfast.   No facility-administered encounter medications on file as of 01/11/2023.    Surgical History: Past Surgical History:  Procedure Laterality Date   BREAST BIOPSY Left 09/27/2022   US biopsy/ coil clip/ path pending   BREAST BIOPSY Right 09/27/2022   US biopsy/ heart clip/ path pending   BREAST BIOPSY Right 09/27/2022   [place] RT BREAST BX W LOC DEV 1ST LESION IMG BX SPEC US GUIDE 09/27/2022 [facility]   BREAST BIOPSY Left 09/27/2022   [place] LT BREAST BX W LOC DEV 1ST LESION IMG BX SPEC US GUIDE 09/27/2022 [facility]    Medical History: Past Medical History:  Diagnosis Date   Asthma    Environmental allergies    Knee fracture, left    Vertigo     Family History: Family History  Problem Relation Age of Onset   Diabetes Mother    Lupus Mother    Hypertension Mother    Hypertension Father    Heart disease Sister    Spina bifida Sister     Social History   Socioeconomic History   Marital status: Significant Other    Spouse name: Not on file   Number of children: Not on file   Years of education: Not on file   Highest education level: Not on file  Occupational History   Not on  file  Tobacco Use   Smoking status: Never   Smokeless tobacco: Never  Vaping Use   Vaping Use: Never used  Substance and Sexual Activity   Alcohol use: Yes    Comment: on occasion   Drug use: No   Sexual activity: Yes    Partners: Male    Birth control/protection: Pill  Other Topics Concern   Not on file  Social History Narrative   Not on file   Social Determinants of Health   Financial Resource Strain: Not on file  Food Insecurity: Not on file  Transportation Needs: Not on file  Physical Activity: Not on file  Stress: Not on file  Social Connections: Not on file  Intimate Partner Violence: Not on file      Review of Systems   Constitutional:  Positive for fatigue and unexpected weight change. Negative for chills.  HENT:  Negative for congestion, postnasal drip, rhinorrhea, sneezing and sore throat.   Eyes:  Negative for redness.  Respiratory:  Negative for cough, chest tightness, shortness of breath and wheezing.   Cardiovascular:  Negative for chest pain and palpitations.  Gastrointestinal:  Negative for abdominal pain, constipation, diarrhea, nausea and vomiting.  Genitourinary:  Negative for dysuria and frequency.  Musculoskeletal:  Positive for myalgias. Negative for arthralgias, back pain, joint swelling and neck pain.  Skin:  Negative for rash.  Neurological: Negative.  Negative for tremors and numbness.  Hematological:  Negative for adenopathy. Does not bruise/bleed easily.  Psychiatric/Behavioral:  Negative for behavioral problems (Depression), sleep disturbance and suicidal ideas. The patient is not nervous/anxious.     Vital Signs: BP (!) 146/95   Temp 97.8 F (36.6 C)   Resp 16   Ht '5\' 2"'$  (1.575 m)   Wt 138 lb 6.4 oz (62.8 kg)   SpO2 98%   BMI 25.31 kg/m    Physical Exam Vitals reviewed.  Constitutional:      General: She is not in acute distress.    Appearance: Normal appearance. She is normal weight. She is not ill-appearing.  HENT:     Head: Normocephalic and atraumatic.  Eyes:     Pupils: Pupils are equal, round, and reactive to light.  Cardiovascular:     Rate and Rhythm: Normal rate and regular rhythm.  Pulmonary:     Effort: Pulmonary effort is normal. No respiratory distress.  Neurological:     Mental Status: She is alert and oriented to person, place, and time.  Psychiatric:        Mood and Affect: Mood normal.        Behavior: Behavior normal.        Assessment/Plan: 1. Abnormal weight gain Continue phentermine for 2 more months, she has lost another 8 lbs.  - phentermine (ADIPEX-P) 37.5 MG tablet; Take 1 tablet (37.5 mg total) by mouth daily before breakfast.   Dispense: 30 tablet; Refill: 0 - phentermine (ADIPEX-P) 37.5 MG tablet; Take 1 tablet (37.5 mg total) by mouth daily before breakfast.  Dispense: 30 tablet; Refill: 0  2. Anxiety Has been much improved.   3. Overweight with body mass index (BMI) of 25 to 25.9 in adult Continue phentermine for 2 more months, follow up in 8 weeks for further assessment  - phentermine (ADIPEX-P) 37.5 MG tablet; Take 1 tablet (37.5 mg total) by mouth daily before breakfast.  Dispense: 30 tablet; Refill: 0 - phentermine (ADIPEX-P) 37.5 MG tablet; Take 1 tablet (37.5 mg total) by mouth daily before breakfast.  Dispense: 30 tablet;  Refill: 0   General Counseling: elisse frappier understanding of the findings of todays visit and agrees with plan of treatment. I have discussed any further diagnostic evaluation that may be needed or ordered today. We also reviewed her medications today. she has been encouraged to call the office with any questions or concerns that should arise related to todays visit.    No orders of the defined types were placed in this encounter.   Meds ordered this encounter  Medications   phentermine (ADIPEX-P) 37.5 MG tablet    Sig: Take 1 tablet (37.5 mg total) by mouth daily before breakfast.    Dispense:  30 tablet    Refill:  0    Will bring goodrx coupon, do not run through insurance.   phentermine (ADIPEX-P) 37.5 MG tablet    Sig: Take 1 tablet (37.5 mg total) by mouth daily before breakfast.    Dispense:  30 tablet    Refill:  0    Will bring goodrx coupon, do not run through insurance.    Return in about 4 weeks (around 02/08/2023) for F/U, Weight loss, Avien Taha PCP.   Total time spent:30 Minutes Time spent includes review of chart, medications, test results, and follow up plan with the patient.   [place] Controlled Substance Database was reviewed by me.  This patient was seen by Jonetta Osgood, FNP-C in collaboration with Dr. Clayborn Bigness as a part of collaborative care  agreement.   Lenaya Pietsch R. Valetta Fuller, MSN, FNP-C Internal medicine

## 2023-01-12 ENCOUNTER — Encounter: Payer: Self-pay | Admitting: Dermatology

## 2023-01-20 ENCOUNTER — Encounter: Payer: Self-pay | Admitting: Nurse Practitioner

## 2023-02-13 ENCOUNTER — Encounter: Payer: Self-pay | Admitting: Nurse Practitioner

## 2023-02-13 ENCOUNTER — Other Ambulatory Visit: Payer: Self-pay | Admitting: Nurse Practitioner

## 2023-02-13 DIAGNOSIS — R635 Abnormal weight gain: Secondary | ICD-10-CM

## 2023-02-13 DIAGNOSIS — Z6825 Body mass index (BMI) 25.0-25.9, adult: Secondary | ICD-10-CM

## 2023-03-08 ENCOUNTER — Ambulatory Visit (INDEPENDENT_AMBULATORY_CARE_PROVIDER_SITE_OTHER): Payer: Managed Care, Other (non HMO) | Admitting: Nurse Practitioner

## 2023-03-08 ENCOUNTER — Encounter: Payer: Self-pay | Admitting: Nurse Practitioner

## 2023-03-08 VITALS — BP 116/86 | HR 104 | Temp 98.1°F | Resp 16 | Ht 62.0 in | Wt 131.0 lb

## 2023-03-08 DIAGNOSIS — Z6825 Body mass index (BMI) 25.0-25.9, adult: Secondary | ICD-10-CM

## 2023-03-08 DIAGNOSIS — D508 Other iron deficiency anemias: Secondary | ICD-10-CM | POA: Diagnosis not present

## 2023-03-08 DIAGNOSIS — E663 Overweight: Secondary | ICD-10-CM | POA: Diagnosis not present

## 2023-03-08 DIAGNOSIS — F419 Anxiety disorder, unspecified: Secondary | ICD-10-CM | POA: Diagnosis not present

## 2023-03-08 DIAGNOSIS — R635 Abnormal weight gain: Secondary | ICD-10-CM | POA: Diagnosis not present

## 2023-03-08 MED ORDER — PHENTERMINE HCL 37.5 MG PO TABS: 37.5000 mg | ORAL_TABLET | Freq: Every day | ORAL | 1 refills | Status: DC

## 2023-03-08 NOTE — Progress Notes (Signed)
[facility] [address] [place], [place] [address]  Internal MEDICINE  Office Visit Note  Patient Name: Laura Morton  [id number]  [id number]  Date of Service: 03/08/2023  Chief Complaint  Patient presents with   Follow-up    Weight loss     HPI Latise presents for a follow-up visit for weight loss management  Lost 7 more lbs, total 32 lbs lost since December. Lost 23 inches on her body, getting into smaller sizes.  Made a cookbook on tik tok with her high protein recipes  Increased workouts from 40 minutes to 60 minutes  Increased weight amounts during workouts up to 15 lbs  Wants to change final goal for her ideal weight to 115 lbs. Which is a BMI around 21.  ESA letter for apartment.    Current Medication: Outpatient Encounter Medications as of 03/08/2023  Medication Sig   albuterol (VENTOLIN HFA) 108 (90 Base) MCG/ACT inhaler Inhale 2 puffs into the lungs every 6 (six) hours as needed for wheezing or shortness of breath.   ANORO ELLIPTA 62.5-25 MCG/ACT AEPB INHALE 1 PUFF BY MOUTH EVERY DAY   busPIRone (BUSPAR) 10 MG tablet Take 2 tablets (20 mg total) by mouth 3 (three) times daily.   DULoxetine (CYMBALTA) 20 MG capsule Take 1 capsule (20 mg total) by mouth daily.   Ferrous Sulfate (IRON PO) Take by mouth. Pt takes the slow release   fluconazole (DIFLUCAN) 150 MG tablet Take 1 tablet once monthly by mouth at onset of symptom prior to start of cycle.   hydrOXYzine (ATARAX) 25 MG tablet Take 1 tablet (25 mg total) by mouth 3 (three) times daily as needed.   Multiple Vitamin (MULTIVITAMIN) tablet Take 1 tablet by mouth daily.   Spacer/Aero-Holding Chambers (AEROCHAMBER PLUS) inhaler Use as instructed   Tdap (BOOSTRIX) 5-2.5-18.5 LF-MCG/0.5 injection Inject 0.5 mLs into the muscle once.   [DISCONTINUED] phentermine (ADIPEX-P) 37.5 MG tablet Take 1 tablet (37.5 mg total) by mouth daily before breakfast.   [DISCONTINUED] phentermine (ADIPEX-P) 37.5 MG tablet Take 1  tablet (37.5 mg total) by mouth daily before breakfast.   phentermine (ADIPEX-P) 37.5 MG tablet Take 1 tablet (37.5 mg total) by mouth daily before breakfast.   No facility-administered encounter medications on file as of 03/08/2023.    Surgical History: Past Surgical History:  Procedure Laterality Date   BREAST BIOPSY Left 09/27/2022   US biopsy/ coil clip/ path pending   BREAST BIOPSY Right 09/27/2022   US biopsy/ heart clip/ path pending   BREAST BIOPSY Right 09/27/2022   [place] RT BREAST BX W LOC DEV 1ST LESION IMG BX SPEC US GUIDE 09/27/2022 [facility]   BREAST BIOPSY Left 09/27/2022   [place] LT BREAST BX W LOC DEV 1ST LESION IMG BX SPEC US GUIDE 09/27/2022 [facility]    Medical History: Past Medical History:  Diagnosis Date   Asthma    Environmental allergies    Knee fracture, left    Vertigo     Family History: Family History  Problem Relation Age of Onset   Diabetes Mother    Lupus Mother    Hypertension Mother    Hypertension Father    Heart disease Sister    Spina bifida Sister     Social History   Socioeconomic History   Marital status: Significant Other    Spouse name: Not on file   Number of children: Not on file   Years of education: Not on file   Highest education level: Not on file  Occupational History   Not on file  Tobacco Use   Smoking status: Never   Smokeless tobacco: Never  Vaping Use   Vaping Use: Never used  Substance and Sexual Activity   Alcohol use: Yes    Comment: on occasion   Drug use: No   Sexual activity: Yes    Partners: Male    Birth control/protection: Pill  Other Topics Concern   Not on file  Social History Narrative   Not on file   Social Determinants of Health   Financial Resource Strain: Not on file  Food Insecurity: Not on file  Transportation Needs: Not on file  Physical Activity: Not on file  Stress: Not on file  Social Connections: Not on file  Intimate Partner Violence: Not on file       Review of Systems  Constitutional:  Positive for activity change and unexpected weight change. Negative for chills and fatigue.  HENT:  Negative for congestion, postnasal drip, rhinorrhea, sneezing and sore throat.   Eyes:  Negative for redness.  Respiratory:  Negative for cough, chest tightness, shortness of breath and wheezing.   Cardiovascular:  Negative for chest pain and palpitations.  Gastrointestinal:  Negative for abdominal pain, constipation, diarrhea, nausea and vomiting.  Genitourinary:  Negative for dysuria and frequency.  Musculoskeletal:  Positive for myalgias. Negative for arthralgias, back pain, joint swelling and neck pain.  Skin:  Negative for rash.  Neurological: Negative.  Negative for tremors and numbness.  Hematological:  Negative for adenopathy. Does not bruise/bleed easily.  Psychiatric/Behavioral:  Negative for behavioral problems (Depression), sleep disturbance and suicidal ideas. The patient is not nervous/anxious.     Vital Signs: BP 116/86   Pulse (!) 104   Temp 98.1 F (36.7 C)   Resp 16   Ht  (1.575 m)   Wt 131 lb (59.4 kg)   SpO2 98%   BMI 23.96 kg/m    Physical Exam Vitals reviewed.  Constitutional:      General: She is not in acute distress.    Appearance: Normal appearance. She is normal weight. She is not ill-appearing.  HENT:     Head: Normocephalic and atraumatic.  Eyes:     Pupils: Pupils are equal, round, and reactive to light.  Cardiovascular:     Rate and Rhythm: Normal rate and regular rhythm.  Pulmonary:     Effort: Pulmonary effort is normal. No respiratory distress.  Neurological:     Mental Status: She is alert and oriented to person, place, and time.  Psychiatric:        Mood and Affect: Mood normal.        Behavior: Behavior normal.        Assessment/Plan: 1. Abnormal weight gain Continued progress, 7 lbs lost. Continue phentermine for 2 months, follow up in 8 weeks to reassess. Goal weight  updated. - phentermine (ADIPEX-P) 37.5 MG tablet; Take 1 tablet (37.5 mg total) by mouth daily before breakfast.  Dispense: 30 tablet; Refill: 1  2. Other iron deficiency anemia Continue to monitor periodically. No intervention at this time  3. Overweight with body mass index (BMI) of 25 to 25.9 in adult Continue phentermine for 2 months, follow up in 8 weeks to reassess weight - phentermine (ADIPEX-P) 37.5 MG tablet; Take 1 tablet (37.5 mg total) by mouth daily before breakfast.  Dispense: 30 tablet; Refill: 1  4. Anxiety Wants to have a note/letter listing her dog as her emotional support animal.    General Counseling:  Tonetta verbalizes understanding of the findings of todays visit and agrees with plan of treatment. I have discussed any further diagnostic evaluation that may be needed or ordered today. We also reviewed her medications today. she has been encouraged to call the office with any questions or concerns that should arise related to todays visit.    No orders of the defined types were placed in this encounter.   Meds ordered this encounter  Medications   phentermine (ADIPEX-P) 37.5 MG tablet    Sig: Take 1 tablet (37.5 mg total) by mouth daily before breakfast.    Dispense:  30 tablet    Refill:  1    Will bring goodrx coupon, do not run through insurance.    Return in about 8 weeks (around 05/03/2023) for F/U, Weight loss, Jered Heiny PCP.   Total time spent:30 Minutes Time spent includes review of chart, medications, test results, and follow up plan with the patient.    Controlled Substance Database was reviewed by me.  This patient was seen by Sallyanne Kuster, FNP-C in collaboration with Dr. Beverely Risen as a part of collaborative care agreement.   Vash Quezada R. Tedd Sias, MSN, FNP-C Internal medicine

## 2023-03-13 ENCOUNTER — Other Ambulatory Visit: Payer: Self-pay | Admitting: Nurse Practitioner

## 2023-03-15 ENCOUNTER — Encounter: Payer: Self-pay | Admitting: Nurse Practitioner

## 2023-05-03 ENCOUNTER — Encounter: Payer: Self-pay | Admitting: Nurse Practitioner

## 2023-05-03 ENCOUNTER — Ambulatory Visit (INDEPENDENT_AMBULATORY_CARE_PROVIDER_SITE_OTHER): Payer: Managed Care, Other (non HMO) | Admitting: Nurse Practitioner

## 2023-05-03 VITALS — BP 120/86 | HR 84 | Temp 98.4°F | Resp 16 | Ht 62.0 in | Wt 127.4 lb

## 2023-05-03 DIAGNOSIS — R635 Abnormal weight gain: Secondary | ICD-10-CM | POA: Diagnosis not present

## 2023-05-03 DIAGNOSIS — Z6825 Body mass index (BMI) 25.0-25.9, adult: Secondary | ICD-10-CM

## 2023-05-03 DIAGNOSIS — E663 Overweight: Secondary | ICD-10-CM | POA: Diagnosis not present

## 2023-05-03 DIAGNOSIS — D508 Other iron deficiency anemias: Secondary | ICD-10-CM | POA: Diagnosis not present

## 2023-05-03 MED ORDER — PHENTERMINE HCL 37.5 MG PO TABS: 37.5000 mg | ORAL_TABLET | Freq: Every day | ORAL | 1 refills | Status: DC

## 2023-05-03 NOTE — Progress Notes (Signed)
[facility] [address] [place], [place] [address]  Internal MEDICINE  Office Visit Note  Patient Name: Laura Morton  [id number]  [id number]  Date of Service: 05/03/2023  Chief Complaint  Patient presents with   Follow-up    Weight loss     HPI Dajonna presents for a follow-up visit for  Lost 4 more lbs, total 36 lbs lost since December. Lost 2 more inches on her body, getting into smaller sizes.  Has a cookbook on tik tok with her high protein recipes  Workouts are at 60 minutes  15 lbs for weight training  Wants to change final goal for her ideal weight to 115 lbs. Which is a BMI around 21.     Current Medication: Outpatient Encounter Medications as of 05/03/2023  Medication Sig   albuterol (VENTOLIN HFA) 108 (90 Base) MCG/ACT inhaler TAKE 2 PUFFS BY MOUTH EVERY 6 HOURS AS NEEDED FOR WHEEZE OR SHORTNESS OF BREATH   ANORO ELLIPTA 62.5-25 MCG/ACT AEPB INHALE 1 PUFF BY MOUTH EVERY DAY   busPIRone (BUSPAR) 10 MG tablet Take 2 tablets (20 mg total) by mouth 3 (three) times daily.   DULoxetine (CYMBALTA) 20 MG capsule Take 1 capsule (20 mg total) by mouth daily.   Ferrous Sulfate (IRON PO) Take by mouth. Pt takes the slow release   fluconazole (DIFLUCAN) 150 MG tablet Take 1 tablet once monthly by mouth at onset of symptom prior to start of cycle.   hydrOXYzine (ATARAX) 25 MG tablet Take 1 tablet (25 mg total) by mouth 3 (three) times daily as needed.   Multiple Vitamin (MULTIVITAMIN) tablet Take 1 tablet by mouth daily.   Spacer/Aero-Holding Chambers (AEROCHAMBER PLUS) inhaler Use as instructed   Tdap (BOOSTRIX) 5-2.5-18.5 LF-MCG/0.5 injection Inject 0.5 mLs into the muscle once.   [DISCONTINUED] phentermine (ADIPEX-P) 37.5 MG tablet Take 1 tablet (37.5 mg total) by mouth daily before breakfast.   phentermine (ADIPEX-P) 37.5 MG tablet Take 1 tablet (37.5 mg total) by mouth daily before breakfast.   No facility-administered encounter medications on file as of 05/03/2023.     Surgical History: Past Surgical History:  Procedure Laterality Date   BREAST BIOPSY Left 09/27/2022   US biopsy/ coil clip/ path pending   BREAST BIOPSY Right 09/27/2022   US biopsy/ heart clip/ path pending   BREAST BIOPSY Right 09/27/2022   [place] RT BREAST BX W LOC DEV 1ST LESION IMG BX SPEC US GUIDE 09/27/2022 [facility]   BREAST BIOPSY Left 09/27/2022   [place] LT BREAST BX W LOC DEV 1ST LESION IMG BX SPEC US GUIDE 09/27/2022 [facility]    Medical History: Past Medical History:  Diagnosis Date   Asthma    Environmental allergies    Knee fracture, left    Vertigo     Family History: Family History  Problem Relation Age of Onset   Diabetes Mother    Lupus Mother    Hypertension Mother    Hypertension Father    Heart disease Sister    Spina bifida Sister     Social History   Socioeconomic History   Marital status: Significant Other    Spouse name: Not on file   Number of children: Not on file   Years of education: Not on file   Highest education level: Not on file  Occupational History   Not on file  Tobacco Use   Smoking status: Never   Smokeless tobacco: Never  Vaping Use   Vaping Use: Never used  Substance and Sexual  Activity   Alcohol use: Yes    Comment: on occasion   Drug use: No   Sexual activity: Yes    Partners: Male    Birth control/protection: Pill  Other Topics Concern   Not on file  Social History Narrative   Not on file   Social Determinants of Health   Financial Resource Strain: Not on file  Food Insecurity: Not on file  Transportation Needs: Not on file  Physical Activity: Not on file  Stress: Not on file  Social Connections: Not on file  Intimate Partner Violence: Not on file      Review of Systems  Constitutional:  Positive for activity change and unexpected weight change. Negative for chills and fatigue.  HENT:  Negative for congestion, postnasal drip, rhinorrhea, sneezing and sore throat.   Eyes:  Negative for  redness.  Respiratory:  Negative for cough, chest tightness, shortness of breath and wheezing.   Cardiovascular:  Negative for chest pain and palpitations.  Gastrointestinal:  Negative for abdominal pain, constipation, diarrhea, nausea and vomiting.  Genitourinary:  Negative for dysuria and frequency.  Musculoskeletal:  Positive for myalgias. Negative for arthralgias, back pain, joint swelling and neck pain.  Skin:  Negative for rash.  Neurological: Negative.  Negative for tremors and numbness.  Hematological:  Negative for adenopathy. Does not bruise/bleed easily.  Psychiatric/Behavioral:  Negative for behavioral problems (Depression), sleep disturbance and suicidal ideas. The patient is not nervous/anxious.     Vital Signs: BP 120/86   Pulse 84   Temp 98.4 F (36.9 C)   Resp 16   Ht 5\' 2"  (1.575 m)   Wt 127 lb 6.4 oz (57.8 kg)   SpO2 99%   BMI 23.30 kg/m    Physical Exam Vitals reviewed.  Constitutional:      General: She is not in acute distress.    Appearance: Normal appearance. She is normal weight. She is not ill-appearing.  HENT:     Head: Normocephalic and atraumatic.  Eyes:     Pupils: Pupils are equal, round, and reactive to light.  Cardiovascular:     Rate and Rhythm: Normal rate and regular rhythm.  Pulmonary:     Effort: Pulmonary effort is normal. No respiratory distress.  Neurological:     Mental Status: She is alert and oriented to person, place, and time.  Psychiatric:        Mood and Affect: Mood normal.        Behavior: Behavior normal.        Assessment/Plan: 1. Other iron deficiency anemia Stable, no issues   2. Abnormal weight gain Continue phentermine as prescribed, follow up in 8 weeks  - phentermine (ADIPEX-P) 37.5 MG tablet; Take 1 tablet (37.5 mg total) by mouth daily before breakfast.  Dispense: 30 tablet; Refill: 1  3. Overweight with body mass index (BMI) of 25 to 25.9 in adult Continue phentermine as prescribed, follow up in 8  weeks  - phentermine (ADIPEX-P) 37.5 MG tablet; Take 1 tablet (37.5 mg total) by mouth daily before breakfast.  Dispense: 30 tablet; Refill: 1   General Counseling: zalena holtzinger understanding of the findings of todays visit and agrees with plan of treatment. I have discussed any further diagnostic evaluation that may be needed or ordered today. We also reviewed her medications today. she has been encouraged to call the office with any questions or concerns that should arise related to todays visit.    No orders of the defined types were placed in  this encounter.   Meds ordered this encounter  Medications   phentermine (ADIPEX-P) 37.5 MG tablet    Sig: Take 1 tablet (37.5 mg total) by mouth daily before breakfast.    Dispense:  30 tablet    Refill:  1    Will bring goodrx coupon, do not run through insurance.    Return in about 8 weeks (around 06/28/2023) for F/U, Weight loss, Lamerle Jabs PCP.   Total time spent:30 Minutes Time spent includes review of chart, medications, test results, and follow up plan with the patient.   [place] Controlled Substance Database was reviewed by me.  This patient was seen by Sallyanne Kuster, FNP-C in collaboration with Dr. Beverely Risen as a part of collaborative care agreement.   Amberlyn Martinezgarcia R. Tedd Sias, MSN, FNP-C Internal medicine

## 2023-05-29 ENCOUNTER — Encounter: Payer: Managed Care, Other (non HMO) | Admitting: Nurse Practitioner

## 2023-06-21 ENCOUNTER — Encounter: Payer: Managed Care, Other (non HMO) | Admitting: Nurse Practitioner

## 2023-06-28 ENCOUNTER — Other Ambulatory Visit
Admission: RE | Admit: 2023-06-28 | Discharge: 2023-06-28 | Disposition: A | Discharge status: Home / Self Care | Payer: Managed Care, Other (non HMO) | Attending: Nurse Practitioner | Admitting: Nurse Practitioner

## 2023-06-28 ENCOUNTER — Ambulatory Visit (INDEPENDENT_AMBULATORY_CARE_PROVIDER_SITE_OTHER): Payer: Managed Care, Other (non HMO) | Admitting: Nurse Practitioner

## 2023-06-28 ENCOUNTER — Encounter: Payer: Self-pay | Admitting: Nurse Practitioner

## 2023-06-28 VITALS — BP 118/76 | HR 96 | Temp 98.7°F | Resp 16 | Ht 62.0 in | Wt 124.0 lb

## 2023-06-28 DIAGNOSIS — D508 Other iron deficiency anemias: Secondary | ICD-10-CM | POA: Insufficient documentation

## 2023-06-28 DIAGNOSIS — Z6825 Body mass index (BMI) 25.0-25.9, adult: Secondary | ICD-10-CM | POA: Diagnosis not present

## 2023-06-28 DIAGNOSIS — R635 Abnormal weight gain: Secondary | ICD-10-CM

## 2023-06-28 DIAGNOSIS — E663 Overweight: Secondary | ICD-10-CM

## 2023-06-28 DIAGNOSIS — R5383 Other fatigue: Secondary | ICD-10-CM

## 2023-06-28 LAB — IRON AND TIBC
Iron: 100 ug/dL (ref 28–170)
Saturation Ratios: 27 % (ref 10.4–31.8)
TIBC: 375 ug/dL (ref 250–450)
UIBC: 275 ug/dL

## 2023-06-28 LAB — CBC WITH DIFFERENTIAL/PLATELET
Abs Immature Granulocytes: 0.01 10*3/uL (ref 0.00–0.07)
Basophils Absolute: 0 10*3/uL (ref 0.0–0.1)
Basophils Relative: 1 %
Eosinophils Absolute: 0.2 10*3/uL (ref 0.0–0.5)
Eosinophils Relative: 4 %
HCT: 43.7 % (ref 36.0–46.0)
Hemoglobin: 14.1 g/dL (ref 12.0–15.0)
Immature Granulocytes: 0 %
Lymphocytes Relative: 34 %
Lymphs Abs: 1.5 10*3/uL (ref 0.7–4.0)
MCH: 28.8 pg (ref 26.0–34.0)
MCHC: 32.3 g/dL (ref 30.0–36.0)
MCV: 89.2 fL (ref 80.0–100.0)
Monocytes Absolute: 0.4 10*3/uL (ref 0.1–1.0)
Monocytes Relative: 8 %
Neutro Abs: 2.4 10*3/uL (ref 1.7–7.7)
Neutrophils Relative %: 53 %
Platelets: 314 10*3/uL (ref 150–400)
RBC: 4.9 MIL/uL (ref 3.87–5.11)
RDW: 13.1 % (ref 11.5–15.5)
WBC: 4.5 10*3/uL (ref 4.0–10.5)
nRBC: 0 % (ref 0.0–0.2)

## 2023-06-28 LAB — VITAMIN B12: Vitamin B-12: 460 pg/mL (ref 180–914)

## 2023-06-28 LAB — FOLATE: Folate: 27 ng/mL (ref >5.9)

## 2023-06-28 LAB — FERRITIN: Ferritin: 26 ng/mL (ref 11–307)

## 2023-06-28 MED ORDER — PHENTERMINE HCL 37.5 MG PO TABS: 37.5000 mg | ORAL_TABLET | Freq: Every day | ORAL | 1 refills | Status: DC

## 2023-06-28 NOTE — Progress Notes (Deleted)
[facility] [address] [place], [place] [address]  Internal MEDICINE  Office Visit Note  Patient Name: Laura Morton  [id number]  [id number]  Date of Service: 06/28/2023  Chief Complaint  Patient presents with  . Follow-up    Weight loss     HPI Laura Morton presents for a follow-up visit for      Current Medication: Outpatient Encounter Medications as of 06/28/2023  Medication Sig  . albuterol (VENTOLIN HFA) 108 (90 Base) MCG/ACT inhaler TAKE 2 PUFFS BY MOUTH EVERY 6 HOURS AS NEEDED FOR WHEEZE OR SHORTNESS OF BREATH  . ANORO ELLIPTA 62.5-25 MCG/ACT AEPB INHALE 1 PUFF BY MOUTH EVERY DAY  . busPIRone (BUSPAR) 10 MG tablet Take 2 tablets (20 mg total) by mouth 3 (three) times daily.  . DULoxetine (CYMBALTA) 20 MG capsule Take 1 capsule (20 mg total) by mouth daily.  . Ferrous Sulfate (IRON PO) Take by mouth. Pt takes the slow release  . fluconazole (DIFLUCAN) 150 MG tablet Take 1 tablet once monthly by mouth at onset of symptom prior to start of cycle.  . hydrOXYzine (ATARAX) 25 MG tablet Take 1 tablet (25 mg total) by mouth 3 (three) times daily as needed.  . Multiple Vitamin (MULTIVITAMIN) tablet Take 1 tablet by mouth daily.  . phentermine (ADIPEX-P) 37.5 MG tablet Take 1 tablet (37.5 mg total) by mouth daily before breakfast.  . Spacer/Aero-Holding Chambers (AEROCHAMBER PLUS) inhaler Use as instructed  . Tdap (BOOSTRIX) 5-2.5-18.5 LF-MCG/0.5 injection Inject 0.5 mLs into the muscle once.   No facility-administered encounter medications on file as of 06/28/2023.    Surgical History: Past Surgical History:  Procedure Laterality Date  . BREAST BIOPSY Left 09/27/2022   US biopsy/ coil clip/ path pending  . BREAST BIOPSY Right 09/27/2022   US biopsy/ heart clip/ path pending  . BREAST BIOPSY Right 09/27/2022   [place] RT BREAST BX W LOC DEV 1ST LESION IMG BX SPEC US GUIDE 09/27/2022 [facility]  . BREAST BIOPSY Left 09/27/2022   [place] LT BREAST BX W LOC DEV 1ST LESION  IMG BX SPEC US GUIDE 09/27/2022 [facility]    Medical History: Past Medical History:  Diagnosis Date  . Asthma   . Environmental allergies   . Knee fracture, left   . Vertigo     Family History: Family History  Problem Relation Age of Onset  . Diabetes Mother   . Lupus Mother   . Hypertension Mother   . Hypertension Father   . Heart disease Sister   . Spina bifida Sister     Social History   Socioeconomic History  . Marital status: Significant Other    Spouse name: Not on file  . Number of children: Not on file  . Years of education: Not on file  . Highest education level: Not on file  Occupational History  . Not on file  Tobacco Use  . Smoking status: Never  . Smokeless tobacco: Never  Vaping Use  . Vaping status: Never Used  Substance and Sexual Activity  . Alcohol use: Yes    Comment: on occasion  . Drug use: No  . Sexual activity: Yes    Partners: Male    Birth control/protection: Pill  Other Topics Concern  . Not on file  Social History Narrative  . Not on file   Social Determinants of Health   Financial Resource Strain: Not on file  Food Insecurity: Not on file  Transportation Needs: Not on file  Physical Activity: Not on file  Stress: Not on file  Social Connections: Not on file  Intimate Partner Violence: Not on file      Review of Systems  Vital Signs: BP 118/76   Pulse (!) 105   Temp 98.7 F (37.1 C)   Resp 16   Ht 5\' 2"  (1.575 m)   Wt 124 lb (56.2 kg)   SpO2 99%   BMI 22.68 kg/m    Physical Exam     Assessment/Plan:   General Counseling: Laura Morton verbalizes understanding of the findings of todays visit and agrees with plan of treatment. I have discussed any further diagnostic evaluation that may be needed or ordered today. We also reviewed her medications today. she has been encouraged to call the office with any questions or concerns that should arise related to todays visit.    No orders of the defined types  were placed in this encounter.   No orders of the defined types were placed in this encounter.   No follow-ups on file.   Total time spent:*** Minutes Time spent includes review of chart, medications, test results, and follow up plan with the patient.   [place] Controlled Substance Database was reviewed by me.  This patient was seen by Sallyanne Kuster, FNP-C in collaboration with Dr. Beverely Risen as a part of collaborative care agreement.   Darleny Sem R. Tedd Sias, MSN, FNP-C Internal medicine

## 2023-06-28 NOTE — Progress Notes (Signed)
[facility] [address] [place], [place] [address]  Internal MEDICINE  Office Visit Note  Patient Name: Laura Morton  [id number]  [id number]  Date of Service: 06/28/2023  Chief Complaint  Patient presents with   Follow-up    Weight loss     HPI Laura Morton presents for a follow-up visit for weight loss, anemia.  Weight loss -- home scale showed 121 lbs on August 1st. Goal is still 115 lbs. So not quite there yet but very close. Will continue with current plan. Still waking up early and exercising every day.  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  Fatigue --  increased, could be from anemia.     Current Medication: Outpatient Encounter Medications as of 06/28/2023  Medication Sig   albuterol (VENTOLIN HFA) 108 (90 Base) MCG/ACT inhaler TAKE 2 PUFFS BY MOUTH EVERY 6 HOURS AS NEEDED FOR WHEEZE OR SHORTNESS OF BREATH   ANORO ELLIPTA 62.5-25 MCG/ACT AEPB INHALE 1 PUFF BY MOUTH EVERY DAY   busPIRone (BUSPAR) 10 MG tablet Take 2 tablets (20 mg total) by mouth 3 (three) times daily.   DULoxetine (CYMBALTA) 20 MG capsule Take 1 capsule (20 mg total) by mouth daily.   Ferrous Sulfate (IRON PO) Take by mouth. Pt takes the slow release   fluconazole (DIFLUCAN) 150 MG tablet Take 1 tablet once monthly by mouth at onset of symptom prior to start of cycle.   hydrOXYzine (ATARAX) 25 MG tablet Take 1 tablet (25 mg total) by mouth 3 (three) times daily as needed.   Multiple Vitamin (MULTIVITAMIN) tablet Take 1 tablet by mouth daily.   phentermine (ADIPEX-P) 37.5 MG tablet Take 1 tablet (37.5 mg total) by mouth daily before breakfast.   Spacer/Aero-Holding Chambers (AEROCHAMBER PLUS) inhaler Use as instructed   Tdap (BOOSTRIX) 5-2.5-18.5 LF-MCG/0.5 injection Inject 0.5 mLs into the muscle once.    No facility-administered encounter medications on file as of 06/28/2023.    Surgical History: Past Surgical History:  Procedure Laterality Date   BREAST BIOPSY Left 09/27/2022   US biopsy/ coil clip/ path pending   BREAST BIOPSY Right 09/27/2022   US biopsy/ heart clip/ path pending   BREAST BIOPSY Right 09/27/2022   [place] RT BREAST BX W LOC DEV 1ST LESION IMG BX SPEC US GUIDE 09/27/2022 [facility]   BREAST BIOPSY Left 09/27/2022   [place] LT BREAST BX W LOC DEV 1ST LESION IMG BX SPEC US GUIDE 09/27/2022 [facility]    Medical History: Past Medical History:  Diagnosis Date   Asthma    Environmental allergies    Knee fracture, left    Vertigo     Family History: Family History  Problem Relation Age of Onset   Diabetes Mother    Lupus Mother    Hypertension Mother    Hypertension Father    Heart disease Sister    Spina bifida Sister     Social History   Socioeconomic History   Marital status: Significant Other    Spouse name: Not on file   Number of children: Not on file   Years of education: Not on file   Highest education level: Not on file  Occupational History   Not on file  Tobacco Use   Smoking status: Never   Smokeless tobacco: Never  Vaping Use  Vaping status: Never Used  Substance and Sexual Activity   Alcohol use: Yes    Comment: on occasion   Drug use: No   Sexual activity: Yes    Partners: Male    Birth control/protection: Pill  Other Topics Concern   Not on file  Social History Narrative   Not on file   Social Determinants of Health   Financial Resource Strain: Not on file  Food Insecurity: Not on file  Transportation Needs: Not on file  Physical Activity: Not on file  Stress: Not on file  Social Connections: Not on file  Intimate Partner Violence: Not on file      Review of Systems  Constitutional:  Positive for appetite change, fatigue and unexpected weight change. Negative for chills.  HENT:  Negative for congestion,  postnasal drip, rhinorrhea, sneezing and sore throat.   Eyes:  Negative for redness.  Respiratory: Negative.  Negative for cough, chest tightness, shortness of breath and wheezing.   Cardiovascular: Negative.  Negative for chest pain and palpitations.  Gastrointestinal: Negative.  Negative for abdominal pain, constipation, diarrhea, nausea and vomiting.  Endocrine: Positive for cold intolerance.  Genitourinary:  Negative for dysuria and frequency.  Musculoskeletal: Negative.  Negative for arthralgias, back pain, joint swelling and neck pain.  Skin:  Negative for rash.  Neurological: Negative.  Negative for tremors and numbness.  Hematological:  Negative for adenopathy. Bruises/bleeds easily.  Psychiatric/Behavioral:  Negative for behavioral problems (Depression), sleep disturbance and suicidal ideas. The patient is not nervous/anxious.     Vital Signs: BP 118/76   Pulse (!) 105   Temp 98.7 F (37.1 C)   Resp 16   Ht 5\' 2"  (1.575 m)   Wt 124 lb (56.2 kg)   SpO2 99%   BMI 22.68 kg/m    Physical Exam Vitals reviewed.  Constitutional:      General: She is not in acute distress.    Appearance: Normal appearance. She is normal weight. She is not ill-appearing.  HENT:     Head: Normocephalic and atraumatic.  Eyes:     Pupils: Pupils are equal, round, and reactive to light.  Cardiovascular:     Rate and Rhythm: Normal rate and regular rhythm.  Pulmonary:     Effort: Pulmonary effort is normal. No respiratory distress.  Neurological:     Mental Status: She is alert and oriented to person, place, and time.  Psychiatric:        Mood and Affect: Mood normal.        Behavior: Behavior normal.        Assessment/Plan: 1. Other iron deficiency anemia Labs ordered to assess for anemia.  - CBC with Differential/Platelet - Iron, TIBC and Ferritin Panel - B12 and Folate Panel  2. Other fatigue Labs ordered to assess for anemia  - CBC with Differential/Platelet - Iron, TIBC  and Ferritin Panel - B12 and Folate Panel  3. Abnormal weight gain Continue phentermine as prescribed. Follow up in 8 weeks - phentermine (ADIPEX-P) 37.5 MG tablet; Take 1 tablet (37.5 mg total) by mouth daily before breakfast.  Dispense: 30 tablet; Refill: 1  4. Overweight with body mass index (BMI) of 25 to 25.9 in adult Continue phentermine as prescribed. Final goal continues to be 115 lbs. Follow up in 8 weeks  - phentermine (ADIPEX-P) 37.5 MG tablet; Take 1 tablet (37.5 mg total) by mouth daily before breakfast.  Dispense: 30 tablet; Refill: 1   General Counseling: raeah stouder understanding of the findings  of todays visit and agrees with plan of treatment. I have discussed any further diagnostic evaluation that may be needed or ordered today. We also reviewed her medications today. she has been encouraged to call the office with any questions or concerns that should arise related to todays visit.    Orders Placed This Encounter  Procedures   CBC with Differential/Platelet   Iron, TIBC and Ferritin Panel   B12 and Folate Panel    No orders of the defined types were placed in this encounter.   Return in about 8 weeks (around 08/23/2023) for F/U, Weight loss and labs for anemia,  PCP.   Total time spent:30 Minutes Time spent includes review of chart, medications, test results, and follow up plan with the patient.   [place] Controlled Substance Database was reviewed by me.  This patient was seen by Sallyanne Kuster, FNP-C in collaboration with Dr. Beverely Risen as a part of collaborative care agreement.    R. Tedd Sias, MSN, FNP-C Internal medicine

## 2023-07-19 ENCOUNTER — Other Ambulatory Visit
Admission: RE | Admit: 2023-07-19 | Discharge: 2023-07-19 | Disposition: A | Discharge status: Home / Self Care | Payer: Managed Care, Other (non HMO) | Attending: Nurse Practitioner | Admitting: Nurse Practitioner

## 2023-07-19 ENCOUNTER — Ambulatory Visit (INDEPENDENT_AMBULATORY_CARE_PROVIDER_SITE_OTHER): Payer: Managed Care, Other (non HMO) | Admitting: Nurse Practitioner

## 2023-07-19 ENCOUNTER — Encounter: Payer: Self-pay | Admitting: Nurse Practitioner

## 2023-07-19 VITALS — BP 120/86 | HR 90 | Temp 98.4°F | Resp 16 | Ht 62.0 in | Wt 126.6 lb

## 2023-07-19 DIAGNOSIS — E559 Vitamin D deficiency, unspecified: Secondary | ICD-10-CM | POA: Diagnosis not present

## 2023-07-19 DIAGNOSIS — Z23 Encounter for immunization: Secondary | ICD-10-CM | POA: Diagnosis not present

## 2023-07-19 DIAGNOSIS — Z0001 Encounter for general adult medical examination with abnormal findings: Secondary | ICD-10-CM

## 2023-07-19 DIAGNOSIS — B3731 Acute candidiasis of vulva and vagina: Secondary | ICD-10-CM | POA: Diagnosis not present

## 2023-07-19 DIAGNOSIS — E782 Mixed hyperlipidemia: Secondary | ICD-10-CM

## 2023-07-19 DIAGNOSIS — Z119 Encounter for screening for infectious and parasitic diseases, unspecified: Secondary | ICD-10-CM

## 2023-07-19 MED ORDER — TETANUS-DIPHTH-ACELL PERTUSSIS 5-2.5-18.5 LF-MCG/0.5 IM SUSP: 0.5000 mL | Freq: Once | INTRAMUSCULAR | 0 refills | Status: AC

## 2023-07-19 MED ORDER — ALBUTEROL SULFATE HFA 108 (90 BASE) MCG/ACT IN AERS: 1.0000 | INHALATION_SPRAY | Freq: Four times a day (QID) | RESPIRATORY_TRACT | 5 refills | Status: DC | PRN

## 2023-07-19 MED ORDER — FLUCONAZOLE 150 MG PO TABS: ORAL_TABLET | ORAL | 0 refills | Status: DC

## 2023-07-19 NOTE — Progress Notes (Signed)
[facility] [address] [place], [place] [address]  Internal MEDICINE  Office Visit Note  Patient Name: Laura Morton  [id number]  [id number]  Date of Service: 07/19/2023  Chief Complaint  Patient presents with   Annual Exam    HPI Laura Morton presents for an annual well visit and physical exam.  Well-appearing 37 y.o. female with anxiety and iron deficiency anemia.  Routine mammogram: not due for 2 more years  Labs: reviewed labs for monitoring anemia and those were stable. Due for routine labs for metabolic panel, cholesterol and vitamin D, also opted for routine STD screening.  New or worsening pain: none  --BP and other vital signs are normal. Still working on her weight loss goal but today is not her weigh in day. She is physically active on a daily basis and eats a healthy diet. She lives with her significant other and he is very supportive of her. Work is going well.    Current Medication: Outpatient Encounter Medications as of 07/19/2023  Medication Sig   Ferrous Sulfate (IRON PO) Take by mouth. Pt takes the slow release   hydrOXYzine (ATARAX) 25 MG tablet Take 1 tablet (25 mg total) by mouth 3 (three) times daily as needed.   Multiple Vitamin (MULTIVITAMIN) tablet Take 1 tablet by mouth daily.   phentermine (ADIPEX-P) 37.5 MG tablet Take 1 tablet (37.5 mg total) by mouth daily before breakfast.   Spacer/Aero-Holding Chambers (AEROCHAMBER PLUS) inhaler Use as instructed   [DISCONTINUED] albuterol (VENTOLIN HFA) 108 (90 Base) MCG/ACT inhaler TAKE 2 PUFFS BY MOUTH EVERY 6 HOURS AS NEEDED FOR WHEEZE OR SHORTNESS OF BREATH   [DISCONTINUED] ANORO ELLIPTA 62.5-25 MCG/ACT AEPB INHALE 1 PUFF BY MOUTH EVERY DAY   [DISCONTINUED] busPIRone (BUSPAR) 10 MG tablet Take 2 tablets (20 mg total) by mouth 3 (three) times daily.   [DISCONTINUED] DULoxetine (CYMBALTA) 20 MG capsule Take 1 capsule (20 mg total) by mouth daily.   [DISCONTINUED] fluconazole (DIFLUCAN) 150 MG tablet Take 1  tablet once monthly by mouth at onset of symptom prior to start of cycle.   [DISCONTINUED] Tdap (BOOSTRIX) 5-2.5-18.5 LF-MCG/0.5 injection Inject 0.5 mLs into the muscle once.   albuterol (VENTOLIN HFA) 108 (90 Base) MCG/ACT inhaler Inhale 1-2 puffs into the lungs every 6 (six) hours as needed for wheezing or shortness of breath.   fluconazole (DIFLUCAN) 150 MG tablet Take 1 tablet once monthly by mouth at onset of symptom prior to start of cycle.   [EXPIRED] Tdap (BOOSTRIX) 5-2.5-18.5 LF-MCG/0.5 injection Inject 0.5 mLs into the muscle once for 1 dose.   No facility-administered encounter medications on file as of 07/19/2023.    Surgical History: Past Surgical History:  Procedure Laterality Date   BREAST BIOPSY Left 09/27/2022   US biopsy/ coil clip/ path pending   BREAST BIOPSY Right 09/27/2022   US biopsy/ heart clip/ path pending   BREAST BIOPSY Right 09/27/2022   [place] RT BREAST BX W LOC DEV 1ST LESION IMG BX SPEC US GUIDE 09/27/2022 [facility]   BREAST BIOPSY Left 09/27/2022   [place] LT BREAST BX W LOC DEV 1ST LESION IMG BX SPEC US GUIDE 09/27/2022 [facility]    Medical History: Past Medical History:  Diagnosis Date   Asthma    Environmental allergies    Knee fracture, left    Vertigo     Family History: Family History  Problem Relation Age of Onset   Diabetes Mother    Lupus Mother    Hypertension Mother    Hypertension  Father    Heart disease Sister    Spina bifida Sister     Social History   Socioeconomic History   Marital status: Significant Other    Spouse name: Not on file   Number of children: Not on file   Years of education: Not on file   Highest education level: Not on file  Occupational History   Not on file  Tobacco Use   Smoking status: Never   Smokeless tobacco: Never  Vaping Use   Vaping status: Never Used  Substance and Sexual Activity   Alcohol use: Yes    Comment: on occasion   Drug use: No   Sexual activity: Yes    Partners:  Male    Birth control/protection: Pill  Other Topics Concern   Not on file  Social History Narrative   Not on file   Social Determinants of Health   Financial Resource Strain: Not on file  Food Insecurity: Not on file  Transportation Needs: Not on file  Physical Activity: Not on file  Stress: Not on file  Social Connections: Not on file  Intimate Partner Violence: Not on file      Review of Systems  Constitutional:  Negative for chills, fatigue and unexpected weight change.  HENT:  Negative for congestion, rhinorrhea, sneezing and sore throat.   Eyes:  Negative for redness.  Respiratory:  Negative for cough, chest tightness and shortness of breath.   Cardiovascular:  Negative for chest pain and palpitations.  Gastrointestinal:  Negative for abdominal pain, constipation, diarrhea, nausea and vomiting.  Genitourinary:  Negative for dysuria and frequency.  Musculoskeletal:  Negative for arthralgias, back pain, joint swelling and neck pain.  Skin:  Negative for rash.  Neurological: Negative.  Negative for tremors and numbness.  Hematological:  Negative for adenopathy. Does not bruise/bleed easily.  Psychiatric/Behavioral:  Negative for behavioral problems (Depression), sleep disturbance and suicidal ideas. The patient is not nervous/anxious.     Vital Signs: BP 120/86   Pulse 90   Temp 98.4 F (36.9 C)   Resp 16   Ht 5\' 2"  (1.575 m)   Wt 126 lb 9.6 oz (57.4 kg)   SpO2 98%   BMI 23.16 kg/m    Physical Exam Vitals reviewed.  Constitutional:      General: She is not in acute distress.    Appearance: Normal appearance. She is well-developed and normal weight. She is not ill-appearing or diaphoretic.  HENT:     Head: Normocephalic and atraumatic.     Right Ear: Tympanic membrane, ear canal and external ear normal.     Left Ear: Tympanic membrane, ear canal and external ear normal.     Nose: Nose normal.     Mouth/Throat:     Mouth: Mucous membranes are moist.      Pharynx: Oropharynx is clear. No oropharyngeal exudate or posterior oropharyngeal erythema.  Eyes:     General: No scleral icterus.       Right eye: No discharge.        Left eye: No discharge.     Extraocular Movements: Extraocular movements intact.     Conjunctiva/sclera: Conjunctivae normal.     Pupils: Pupils are equal, round, and reactive to light.  Neck:     Thyroid: No thyromegaly.     Vascular: No JVD.     Trachea: No tracheal deviation.  Cardiovascular:     Rate and Rhythm: Normal rate and regular rhythm.     Heart sounds: Normal heart  sounds. No murmur heard.    No friction rub. No gallop.  Pulmonary:     Effort: Pulmonary effort is normal. No respiratory distress.     Breath sounds: Normal breath sounds. No stridor. No wheezing or rales.  Chest:     Chest wall: No tenderness.  Breasts:    Right: Normal.     Left: Normal.     Comments: Bilateral cyst present mobile Abdominal:     General: Bowel sounds are normal. There is no distension.     Palpations: Abdomen is soft. There is no mass.     Tenderness: There is no abdominal tenderness. There is no guarding or rebound.  Musculoskeletal:        General: No tenderness or deformity. Normal range of motion.     Cervical back: Normal range of motion and neck supple.     Right lower leg: No edema.     Left lower leg: No edema.  Lymphadenopathy:     Cervical: No cervical adenopathy.  Skin:    General: Skin is warm and dry.     Capillary Refill: Capillary refill takes less than 2 seconds.     Coloration: Skin is not pale.     Findings: No erythema or rash.  Neurological:     Mental Status: She is alert and oriented to person, place, and time.     Cranial Nerves: No cranial nerve deficit.     Motor: No abnormal muscle tone.     Coordination: Coordination normal.     Gait: Gait normal.     Deep Tendon Reflexes: Reflexes are normal and symmetric.  Psychiatric:        Mood and Affect: Mood normal.        Behavior:  Behavior normal.        Thought Content: Thought content normal.        Judgment: Judgment normal.        Assessment/Plan: 1. Encounter for routine adult health examination with abnormal findings Age-appropriate preventive screenings and vaccinations discussed, annual physical exam completed. Routine labs for health maintenance ordered, see below. PHM updated.  - CMP14+EGFR - Lipid Profile - Vitamin D (25 hydroxy) - albuterol (VENTOLIN HFA) 108 (90 Base) MCG/ACT inhaler; Inhale 1-2 puffs into the lungs every 6 (six) hours as needed for wheezing or shortness of breath.  Dispense: 8 g; Refill: 5  2. Mixed hyperlipidemia Routine labs ordered  - CMP14+EGFR - Lipid Profile  3. Vitamin D deficiency Routine labs ordered  - Vitamin D (25 hydroxy)  4. Vulvovaginal candidiasis Refills ordered.  - fluconazole (DIFLUCAN) 150 MG tablet; Take 1 tablet once monthly by mouth at onset of symptom prior to start of cycle.  Dispense: 12 tablet; Refill: 0  5. Need for vaccination - Tdap (BOOSTRIX) 5-2.5-18.5 LF-MCG/0.5 injection; Inject 0.5 mLs into the muscle once for 1 dose.  Dispense: 0.5 mL; Refill: 0  6. Screening examination for infectious disease Routine lab ordered  - STI Profile     General Counseling: girtrude costilow understanding of the findings of todays visit and agrees with plan of treatment. I have discussed any further diagnostic evaluation that may be needed or ordered today. We also reviewed her medications today. she has been encouraged to call the office with any questions or concerns that should arise related to todays visit.    Orders Placed This Encounter  Procedures   CMP14+EGFR   Lipid Profile   Vitamin D (25 hydroxy)   STI Profile  Meds ordered this encounter  Medications   Tdap (BOOSTRIX) 5-2.5-18.5 LF-MCG/0.5 injection    Sig: Inject 0.5 mLs into the muscle once for 1 dose.    Dispense:  0.5 mL    Refill:  0   fluconazole (DIFLUCAN) 150 MG tablet     Sig: Take 1 tablet once monthly by mouth at onset of symptom prior to start of cycle.    Dispense:  12 tablet    Refill:  0   albuterol (VENTOLIN HFA) 108 (90 Base) MCG/ACT inhaler    Sig: Inhale 1-2 puffs into the lungs every 6 (six) hours as needed for wheezing or shortness of breath.    Dispense:  8 g    Refill:  5    Patient will call if she needs this filled, do not fill unless it is requested by patient.    Return for previously scheduled, F/U, Weight loss, Ethelyne Erich PCP coming up soon..   Total time spent:30 Minutes Time spent includes review of chart, medications, test results, and follow up plan with the patient.   [place] Controlled Substance Database was reviewed by me.  This patient was seen by Laura Kuster, FNP-C in collaboration with Dr. Beverely Risen as a part of collaborative care agreement.  Rainey Rodger R. Tedd Sias, MSN, FNP-C Internal medicine

## 2023-07-20 ENCOUNTER — Encounter: Payer: Self-pay | Admitting: Nurse Practitioner

## 2023-07-23 NOTE — Group Note (Deleted)

## 2023-08-17 ENCOUNTER — Telehealth: Payer: Self-pay | Admitting: Nurse Practitioner

## 2023-08-17 NOTE — Telephone Encounter (Signed)
Lvm regarding labs not done yet-Toni

## 2023-08-21 ENCOUNTER — Other Ambulatory Visit
Admission: RE | Admit: 2023-08-21 | Discharge: 2023-08-21 | Disposition: A | Discharge status: Home / Self Care | Payer: Managed Care, Other (non HMO) | Source: Ambulatory Visit | Attending: Nurse Practitioner | Admitting: Nurse Practitioner

## 2023-08-21 DIAGNOSIS — E782 Mixed hyperlipidemia: Secondary | ICD-10-CM | POA: Diagnosis not present

## 2023-08-21 DIAGNOSIS — Z0001 Encounter for general adult medical examination with abnormal findings: Secondary | ICD-10-CM | POA: Insufficient documentation

## 2023-08-21 DIAGNOSIS — E559 Vitamin D deficiency, unspecified: Secondary | ICD-10-CM | POA: Insufficient documentation

## 2023-08-21 DIAGNOSIS — Z119 Encounter for screening for infectious and parasitic diseases, unspecified: Secondary | ICD-10-CM | POA: Diagnosis not present

## 2023-08-21 LAB — COMPREHENSIVE METABOLIC PANEL
ALT: 14 U/L (ref 0–44)
AST: 17 U/L (ref 15–41)
Albumin: 4 g/dL (ref 3.5–5.0)
Alkaline Phosphatase: 50 U/L (ref 38–126)
Anion gap: 8 (ref 5–15)
BUN: 12 mg/dL (ref 6–20)
CO2: 24 mmol/L (ref 22–32)
Calcium: 8.6 mg/dL — ABNORMAL LOW (ref 8.9–10.3)
Chloride: 105 mmol/L (ref 98–111)
Creatinine, Ser: 0.67 mg/dL (ref 0.44–1.00)
GFR, Estimated: >60 mL/min (ref >60)
Glucose, Bld: 96 mg/dL (ref 70–99)
Potassium: 3.6 mmol/L (ref 3.5–5.1)
Sodium: 137 mmol/L (ref 135–145)
Total Bilirubin: 0.8 mg/dL (ref 0.3–1.2)
Total Protein: 7.1 g/dL (ref 6.5–8.1)

## 2023-08-21 LAB — LIPID PANEL
Cholesterol: 177 mg/dL (ref 0–200)
HDL: 77 mg/dL (ref >40)
LDL Cholesterol: 91 mg/dL (ref 0–99)
Total CHOL/HDL Ratio: 2.3 {ratio}
Triglycerides: 47 mg/dL (ref <150)
VLDL: 9 mg/dL (ref 0–40)

## 2023-08-21 LAB — VITAMIN D 25 HYDROXY (VIT D DEFICIENCY, FRACTURES): Vit D, 25-Hydroxy: 28.82 ng/mL — ABNORMAL LOW (ref 30–100)

## 2023-08-22 LAB — MISC LABCORP TEST (SEND OUT): Labcorp test code: 144011

## 2023-08-23 ENCOUNTER — Ambulatory Visit (INDEPENDENT_AMBULATORY_CARE_PROVIDER_SITE_OTHER): Payer: Managed Care, Other (non HMO) | Admitting: Nurse Practitioner

## 2023-08-23 ENCOUNTER — Encounter: Payer: Self-pay | Admitting: Nurse Practitioner

## 2023-08-23 VITALS — BP 126/88 | HR 95 | Temp 98.5°F | Resp 16 | Ht 62.0 in | Wt 123.2 lb

## 2023-08-23 DIAGNOSIS — E559 Vitamin D deficiency, unspecified: Secondary | ICD-10-CM | POA: Diagnosis not present

## 2023-08-23 DIAGNOSIS — E663 Overweight: Secondary | ICD-10-CM | POA: Diagnosis not present

## 2023-08-23 DIAGNOSIS — D508 Other iron deficiency anemias: Secondary | ICD-10-CM

## 2023-08-23 DIAGNOSIS — B9562 Methicillin resistant Staphylococcus aureus infection as the cause of diseases classified elsewhere: Secondary | ICD-10-CM

## 2023-08-23 DIAGNOSIS — Z6825 Body mass index (BMI) 25.0-25.9, adult: Secondary | ICD-10-CM

## 2023-08-23 DIAGNOSIS — Z0184 Encounter for antibody response examination: Secondary | ICD-10-CM

## 2023-08-23 DIAGNOSIS — R635 Abnormal weight gain: Secondary | ICD-10-CM

## 2023-08-23 MED ORDER — PHENTERMINE HCL 37.5 MG PO TABS: 37.5000 mg | ORAL_TABLET | Freq: Every day | ORAL | 1 refills | Status: DC

## 2023-08-23 MED ORDER — HEPATITIS B VAC RECOMBINANT 5 MCG/0.5ML IJ SUSY: 1.0000 mL | PREFILLED_SYRINGE | Freq: Once | INTRAMUSCULAR | 3 refills | Status: DC | PRN

## 2023-08-23 NOTE — Progress Notes (Signed)
[facility] [address] [place], [place] [address]  Internal MEDICINE  Office Visit Note  Patient Name: Laura Morton  [id number]  [id number]  Date of Service: 08/23/2023  Chief Complaint  Patient presents with   Anemia   Follow-up    Weight loss    HPI Laura Morton presents for a follow-up visit for anemia, weight loss, lab results reviewed. Home scale was 119.2 lbs on last Friday, today's weight is 123 lbs, per patient her cycle is about to start.  Cholesterol is normal Vitamin D is improving but slightly low  STI profile negative Hep B surface antibody nonreactive -- nonimmune to hep B    Current Medication: Outpatient Encounter Medications as of 08/23/2023  Medication Sig   albuterol (VENTOLIN HFA) 108 (90 Base) MCG/ACT inhaler Inhale 1-2 puffs into the lungs every 6 (six) hours as needed for wheezing or shortness of breath.   Ferrous Sulfate (IRON PO) Take by mouth. Pt takes the slow release   fluconazole (DIFLUCAN) 150 MG tablet Take 1 tablet once monthly by mouth at onset of symptom prior to start of cycle.   hepatitis B vacrRecombinant (RECOMBIVAX HB) 5 MCG/0.5ML SUSY injection Inject 1 mL into the muscle once as needed for up to 1 dose.   hydrOXYzine (ATARAX) 25 MG tablet Take 1 tablet (25 mg total) by mouth 3 (three) times daily as needed.   Multiple Vitamin (MULTIVITAMIN) tablet Take 1 tablet by mouth daily.   Spacer/Aero-Holding Chambers (AEROCHAMBER PLUS) inhaler Use as instructed   [DISCONTINUED] phentermine (ADIPEX-P) 37.5 MG tablet Take 1 tablet (37.5 mg total) by mouth daily before breakfast.   phentermine (ADIPEX-P) 37.5 MG tablet Take 1 tablet (37.5 mg total) by mouth daily before breakfast.   No facility-administered encounter medications on file as of 08/23/2023.    Surgical History: Past Surgical History:  Procedure Laterality Date   BREAST BIOPSY Left 09/27/2022   US biopsy/ coil clip/ path pending   BREAST BIOPSY Right 09/27/2022   US  biopsy/ heart clip/ path pending   BREAST BIOPSY Right 09/27/2022   [place] RT BREAST BX W LOC DEV 1ST LESION IMG BX SPEC US GUIDE 09/27/2022 [facility]   BREAST BIOPSY Left 09/27/2022   [place] LT BREAST BX W LOC DEV 1ST LESION IMG BX SPEC US GUIDE 09/27/2022 [facility]    Medical History: Past Medical History:  Diagnosis Date   Asthma    Environmental allergies    Knee fracture, left    Vertigo     Family History: Family History  Problem Relation Age of Onset   Diabetes Mother    Lupus Mother    Hypertension Mother    Hypertension Father    Heart disease Sister    Spina bifida Sister     Social History   Socioeconomic History   Marital status: Significant Other    Spouse name: Not on file   Number of children: Not on file   Years of education: Not on file   Highest education level: Not on file  Occupational History   Not on file  Tobacco Use   Smoking status: Never   Smokeless tobacco: Never  Vaping Use   Vaping status: Never Used  Substance and Sexual Activity   Alcohol use: Yes    Comment: on occasion   Drug use: No   Sexual activity: Yes    Partners: Male    Birth control/protection: Pill  Other Topics Concern   Not on file  Social History Narrative  Not on file   Social Determinants of Health   Financial Resource Strain: Not on file  Food Insecurity: Not on file  Transportation Needs: Not on file  Physical Activity: Not on file  Stress: Not on file  Social Connections: Not on file  Intimate Partner Violence: Not on file      Review of Systems  Constitutional:  Positive for appetite change, fatigue and unexpected weight change. Negative for chills.  HENT:  Negative for congestion, postnasal drip, rhinorrhea, sneezing and sore throat.   Eyes:  Negative for redness.  Respiratory: Negative.  Negative for cough, chest tightness, shortness of breath and wheezing.   Cardiovascular: Negative.  Negative for chest pain and palpitations.   Gastrointestinal: Negative.  Negative for abdominal pain, constipation, diarrhea, nausea and vomiting.  Endocrine: Positive for cold intolerance.  Genitourinary:  Negative for dysuria and frequency.  Musculoskeletal: Negative.  Negative for arthralgias, back pain, joint swelling and neck pain.  Skin:  Negative for rash.  Neurological: Negative.  Negative for tremors and numbness.  Hematological:  Negative for adenopathy. Bruises/bleeds easily.  Psychiatric/Behavioral:  Negative for behavioral problems (Depression), sleep disturbance and suicidal ideas. The patient is not nervous/anxious.     Vital Signs: BP 126/88   Pulse 95   Temp 98.5 F (36.9 C)   Resp 16   Ht 5\' 2"  (1.575 m)   Wt 123 lb 3.2 oz (55.9 kg)   SpO2 99%   BMI 22.53 kg/m    Physical Exam Vitals reviewed.  Constitutional:      General: She is not in acute distress.    Appearance: Normal appearance. She is normal weight. She is not ill-appearing.  HENT:     Head: Normocephalic and atraumatic.  Eyes:     Pupils: Pupils are equal, round, and reactive to light.  Cardiovascular:     Rate and Rhythm: Normal rate and regular rhythm.  Pulmonary:     Effort: Pulmonary effort is normal. No respiratory distress.  Neurological:     Mental Status: She is alert and oriented to person, place, and time.  Psychiatric:        Mood and Affect: Mood normal.        Behavior: Behavior normal.        Assessment/Plan: 1. Other iron deficiency anemia Stable, most recent labs are normal. Continue OTC iron supplement.   2. Vitamin D deficiency Continue OTC vitamin D supplement as discussed.   3. Overweight with body mass index (BMI) of 25 to 25.9 in adult Continue phentermine as prescribed. Follow up in 8 weeks for weigh in.  - phentermine (ADIPEX-P) 37.5 MG tablet; Take 1 tablet (37.5 mg total) by mouth daily before breakfast.  Dispense: 30 tablet; Refill: 1  4. 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  - hepatitis B vacrRecombinant (RECOMBIVAX HB) 5 MCG/0.5ML SUSY injection; Inject 1 mL into the muscle once as needed for up to 1 dose.  Dispense: 1 mL; Refill: 3   General Counseling: Zerenity verbalizes understanding of the findings of todays visit and agrees with plan of treatment. I have discussed any further diagnostic evaluation that may be needed or ordered today. We also reviewed her medications today. she has been encouraged to call the office with any questions or concerns that should arise related to todays visit.    No orders of the defined types were  placed in this encounter.   Meds ordered this encounter  Medications   hepatitis B vacrRecombinant (RECOMBIVAX HB) 5 MCG/0.5ML SUSY injection    Sig: Inject 1 mL into the muscle once as needed for up to 1 dose.    Dispense:  1 mL    Refill:  3    Patient in need of hep B 3 dose series. Hep B surface antibody is nonreactive.   phentermine (ADIPEX-P) 37.5 MG tablet    Sig: Take 1 tablet (37.5 mg total) by mouth daily before breakfast.    Dispense:  30 tablet    Refill:  1    Will bring goodrx coupon, do not run through insurance.    Return in about 8 weeks (around 10/18/2023) for F/U, Weight loss, Peggie Hornak PCP.   Total time spent:30 Minutes Time spent includes review of chart, medications, test results, and follow up plan with the patient.   [place] Controlled Substance Database was reviewed by me.  This patient was seen by Sallyanne Kuster, FNP-C in collaboration with Dr. Beverely Risen as a part of collaborative care agreement.   Saffron Busey R. Tedd Sias, MSN, FNP-C Internal medicine

## 2023-10-01 ENCOUNTER — Encounter: Payer: Self-pay | Admitting: Nurse Practitioner

## 2023-10-04 ENCOUNTER — Encounter: Payer: Self-pay | Admitting: Nurse Practitioner

## 2023-10-04 ENCOUNTER — Other Ambulatory Visit: Payer: Self-pay | Admitting: Internal Medicine

## 2023-10-04 NOTE — Telephone Encounter (Signed)
Can u please let us know how to order this

## 2023-10-05 ENCOUNTER — Other Ambulatory Visit: Payer: Self-pay | Admitting: Internal Medicine

## 2023-10-05 ENCOUNTER — Telehealth: Payer: Self-pay | Admitting: Internal Medicine

## 2023-10-05 DIAGNOSIS — F419 Anxiety disorder, unspecified: Secondary | ICD-10-CM

## 2023-10-05 NOTE — Telephone Encounter (Signed)
Psychology referral faxed to [facility]; [phone] Notified patient. Gave pt telephone [phone] ext [phone]-Toni

## 2023-10-05 NOTE — Telephone Encounter (Signed)
Done

## 2023-10-10 ENCOUNTER — Encounter: Payer: Self-pay | Admitting: Nurse Practitioner

## 2023-10-10 DIAGNOSIS — D649 Anemia, unspecified: Secondary | ICD-10-CM | POA: Insufficient documentation

## 2023-10-10 DIAGNOSIS — Z6825 Body mass index (BMI) 25.0-25.9, adult: Secondary | ICD-10-CM | POA: Insufficient documentation

## 2023-10-10 DIAGNOSIS — Z0184 Encounter for antibody response examination: Secondary | ICD-10-CM | POA: Insufficient documentation

## 2023-10-10 DIAGNOSIS — E559 Vitamin D deficiency, unspecified: Secondary | ICD-10-CM | POA: Insufficient documentation

## 2023-10-18 ENCOUNTER — Encounter: Payer: Self-pay | Admitting: Nurse Practitioner

## 2023-10-18 ENCOUNTER — Ambulatory Visit (INDEPENDENT_AMBULATORY_CARE_PROVIDER_SITE_OTHER): Payer: Managed Care, Other (non HMO) | Admitting: Nurse Practitioner

## 2023-10-18 VITALS — BP 130/84 | HR 93 | Temp 98.4°F | Resp 16 | Ht 62.0 in | Wt 122.6 lb

## 2023-10-18 DIAGNOSIS — Z6825 Body mass index (BMI) 25.0-25.9, adult: Secondary | ICD-10-CM

## 2023-10-18 DIAGNOSIS — F419 Anxiety disorder, unspecified: Secondary | ICD-10-CM | POA: Diagnosis not present

## 2023-10-18 DIAGNOSIS — B351 Tinea unguium: Secondary | ICD-10-CM

## 2023-10-18 DIAGNOSIS — E663 Overweight: Secondary | ICD-10-CM | POA: Diagnosis not present

## 2023-10-18 MED ORDER — TERBINAFINE HCL 250 MG PO TABS: 250.0000 mg | ORAL_TABLET | Freq: Every day | ORAL | 0 refills | Status: DC

## 2023-10-18 MED ORDER — PHENTERMINE HCL 37.5 MG PO TABS: 37.5000 mg | ORAL_TABLET | Freq: Every day | ORAL | 2 refills | Status: DC

## 2023-10-18 NOTE — Progress Notes (Signed)
[facility] [address] [place], [place] [address]  Internal MEDICINE  Office Visit Note  Patient Name: Laura Morton  [id number]  [id number]  Date of Service: 10/18/2023  Chief Complaint  Patient presents with   Follow-up    Weight loss     HPI Laura Morton presents for a follow-up visit for weight loss, onychomycosis, and upcoming procedure.  Did not weight on home scale due to starting cycle. Today's weight is 122 lbs. Lost 1 lbs according to office weight from last office visit.  Wants to wait and do a 3 month visit for next time to get through the holidays. Wants to continue phentermine. Denies any palpitations or other adverse side effects of the medication. Is aware of the possible risks of the medication as previously discussed.  Had very bad cramping after eating regular macaroni noodles for the first time in a while.  Meeting with OBGYN on Tuesday next week to discuss tubal ligation Has fungal nail infection on bilateral great toes. Requesting to start oral medication to treat this.      Current Medication: Outpatient Encounter Medications as of 10/18/2023  Medication Sig   albuterol (VENTOLIN HFA) 108 (90 Base) MCG/ACT inhaler Inhale 1-2 puffs into the lungs every 6 (six) hours as needed for wheezing or shortness of breath.   Ferrous Sulfate (IRON PO) Take by mouth. Pt takes the slow release   fluconazole (DIFLUCAN) 150 MG tablet Take 1 tablet once monthly by mouth at onset of symptom prior to start of cycle.   hepatitis B vacrRecombinant (RECOMBIVAX HB) 5 MCG/0.5ML SUSY injection Inject 1 mL into the muscle once as needed for up to 1 dose.   hydrOXYzine (ATARAX) 25 MG tablet Take 1 tablet (25 mg total) by mouth 3 (three) times daily as needed.   Multiple Vitamin (MULTIVITAMIN) tablet Take 1 tablet by mouth daily.   Spacer/Aero-Holding Chambers (AEROCHAMBER PLUS) inhaler Use as instructed   terbinafine (LAMISIL) 250 MG tablet Take 1 tablet (250 mg total) by mouth  daily.   [DISCONTINUED] phentermine (ADIPEX-P) 37.5 MG tablet Take 1 tablet (37.5 mg total) by mouth daily before breakfast.   phentermine (ADIPEX-P) 37.5 MG tablet Take 1 tablet (37.5 mg total) by mouth daily before breakfast.   No facility-administered encounter medications on file as of 10/18/2023.    Surgical History: Past Surgical History:  Procedure Laterality Date   BREAST BIOPSY Left 09/27/2022   US biopsy/ coil clip/ path pending   BREAST BIOPSY Right 09/27/2022   US biopsy/ heart clip/ path pending   BREAST BIOPSY Right 09/27/2022   [place] RT BREAST BX W LOC DEV 1ST LESION IMG BX SPEC US GUIDE 09/27/2022 [facility]   BREAST BIOPSY Left 09/27/2022   [place] LT BREAST BX W LOC DEV 1ST LESION IMG BX SPEC US GUIDE 09/27/2022 [facility]    Medical History: Past Medical History:  Diagnosis Date   Asthma    Environmental allergies    Knee fracture, left    Vertigo     Family History: Family History  Problem Relation Age of Onset   Diabetes Mother    Lupus Mother    Hypertension Mother    Hypertension Father    Heart disease Sister    Spina bifida Sister     Social History   Socioeconomic History   Marital status: Significant Other    Spouse name: Not on file   Number of children: Not on file   Years of education: Not on file   Highest  education level: Not on file  Occupational History   Not on file  Tobacco Use   Smoking status: Never   Smokeless tobacco: Never  Vaping Use   Vaping status: Never Used  Substance and Sexual Activity   Alcohol use: Yes    Comment: on occasion   Drug use: No   Sexual activity: Yes    Partners: Male    Birth control/protection: Pill  Other Topics Concern   Not on file  Social History Narrative   Not on file   Social Determinants of Health   Financial Resource Strain: Not on file  Food Insecurity: Not on file  Transportation Needs: Not on file  Physical Activity: Not on file  Stress: Not on file  Social  Connections: Not on file  Intimate Partner Violence: Not on file      Review of Systems  Constitutional:  Positive for appetite change, fatigue and unexpected weight change. Negative for chills.  HENT:  Negative for congestion, postnasal drip, rhinorrhea, sneezing and sore throat.   Eyes:  Negative for redness.  Respiratory: Negative.  Negative for cough, chest tightness, shortness of breath and wheezing.   Cardiovascular: Negative.  Negative for chest pain and palpitations.  Gastrointestinal: Negative.  Negative for abdominal pain, constipation, diarrhea, nausea and vomiting.  Endocrine: Positive for cold intolerance.  Genitourinary:  Negative for dysuria and frequency.  Musculoskeletal: Negative.  Negative for arthralgias, back pain, joint swelling and neck pain.  Skin:  Negative for rash.  Neurological: Negative.  Negative for tremors and numbness.  Hematological:  Negative for adenopathy. Bruises/bleeds easily.  Psychiatric/Behavioral:  Negative for behavioral problems (Depression), sleep disturbance and suicidal ideas. The patient is not nervous/anxious.     Vital Signs: BP 130/84   Pulse 93   Temp 98.4 F (36.9 C)   Resp 16   Ht 5\' 2"  (1.575 m)   Wt 122 lb 9.6 oz (55.6 kg)   SpO2 99%   BMI 22.42 kg/m    Physical Exam Vitals reviewed.  Constitutional:      General: She is not in acute distress.    Appearance: Normal appearance. She is normal weight. She is not ill-appearing.  HENT:     Head: Normocephalic and atraumatic.  Eyes:     Pupils: Pupils are equal, round, and reactive to light.  Cardiovascular:     Rate and Rhythm: Normal rate and regular rhythm.  Pulmonary:     Effort: Pulmonary effort is normal. No respiratory distress.  Neurological:     Mental Status: She is alert and oriented to person, place, and time.  Psychiatric:        Mood and Affect: Mood normal.        Behavior: Behavior normal.        Assessment/Plan: 1. Onychomycosis of  toenail Take terbinafine as prescribed. Repeat liver function panel after completing course of medication.  - terbinafine (LAMISIL) 250 MG tablet; Take 1 tablet (250 mg total) by mouth daily.  Dispense: 90 tablet; Refill: 0  2. Overweight with body mass index (BMI) of 25 to 25.9 in adult Continue phentermine as prescribed. Follow up in 12 weeks after the holidays for next weigh in.  - phentermine (ADIPEX-P) 37.5 MG tablet; Take 1 tablet (37.5 mg total) by mouth daily before breakfast.  Dispense: 30 tablet; Refill: 2  3. Anxiety Patient will look into [facility] and choose a therapist, and let me know, will fax the referral if her insurance requires it.  General Counseling: ottis critcher understanding of the findings of todays visit and agrees with plan of treatment. I have discussed any further diagnostic evaluation that may be needed or ordered today. We also reviewed her medications today. she has been encouraged to call the office with any questions or concerns that should arise related to todays visit.    No orders of the defined types were placed in this encounter.   Meds ordered this encounter  Medications   terbinafine (LAMISIL) 250 MG tablet    Sig: Take 1 tablet (250 mg total) by mouth daily.    Dispense:  90 tablet    Refill:  0   phentermine (ADIPEX-P) 37.5 MG tablet    Sig: Take 1 tablet (37.5 mg total) by mouth daily before breakfast.    Dispense:  30 tablet    Refill:  2    Will bring goodrx coupon, do not run through insurance.    Return in about 3 months (around 01/16/2024) for F/U, Weight loss, Marney Treloar PCP.   Total time spent:30 Minutes Time spent includes review of chart, medications, test results, and follow up plan with the patient.   [place] Controlled Substance Database was reviewed by me.  This patient was seen by Sallyanne Kuster, FNP-C in collaboration with Dr. Beverely Risen as a part of collaborative care agreement.   Labria Wos R.  Tedd Sias, MSN, FNP-C Internal medicine

## 2023-10-23 DIAGNOSIS — Z3009 Encounter for other general counseling and advice on contraception: Secondary | ICD-10-CM | POA: Diagnosis not present
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
## 2023-11-16 ENCOUNTER — Other Ambulatory Visit: Payer: Self-pay | Admitting: Obstetrics and Gynecology

## 2023-11-16 ENCOUNTER — Encounter
Admission: RE | Admit: 2023-11-16 | Discharge: 2023-11-16 | Disposition: A | Discharge status: Home / Self Care | Payer: Managed Care, Other (non HMO) | Source: Ambulatory Visit | Attending: Obstetrics and Gynecology | Admitting: Obstetrics and Gynecology

## 2023-11-16 ENCOUNTER — Other Ambulatory Visit: Payer: Self-pay

## 2023-11-16 VITALS — Ht 62.0 in | Wt 117.0 lb

## 2023-11-16 DIAGNOSIS — Z01812 Encounter for preprocedural laboratory examination: Secondary | ICD-10-CM

## 2023-11-16 HISTORY — DX: Anemia, unspecified: D64.9

## 2023-11-16 HISTORY — DX: Anxiety disorder, unspecified: F41.9

## 2023-11-16 HISTORY — DX: Vitamin D deficiency, unspecified: E55.9

## 2023-11-16 HISTORY — DX: Depression, unspecified: F32.A

## 2023-11-16 NOTE — Patient Instructions (Addendum)
 Your procedure is scheduled on: Friday, January 10 Report to the Registration Desk on the 1st floor of the [facility] To find out your arrival time, please call [phone] between 1PM - 3PM on: Thursday, January 9 If your arrival time is 6:00 am, do not arrive before that time as the [facility] entrance doors do not open until 6:00 am.  REMEMBER: Instructions that are not followed completely may result in serious medical risk, up to and including death; or upon the discretion of your surgeon and anesthesiologist your surgery may need to be rescheduled.  Do not eat food after midnight the night before surgery.  No gum chewing or hard candies.  You may however, drink CLEAR liquids up to 2 hours before you are scheduled to arrive for your surgery. Do not drink anything within 2 hours of your scheduled arrival time.  Clear liquids include: - water  - apple juice without pulp - gatorade (not RED colors) - black coffee or tea (Do NOT add milk or creamers to the coffee or tea) Do NOT drink anything that is not on this list.  One week prior to surgery: starting January 3 Stop Anti-inflammatories (NSAIDS) such as Advil , Aleve, Ibuprofen , Motrin , Naproxen, Naprosyn and Aspirin based products such as Excedrin, Goody's Powder, BC Powder. Stop ANY OVER THE COUNTER supplements until after surgery. Stop multiple vitamin.  You may however, continue to take Tylenol  if needed for pain up until the day of surgery.  Phentermine  - hold for 7 days before surgery. Last day to take is January 3. Resume AFTER surgery.  Continue taking all of your other prescription medications up until the day of surgery.  ON THE DAY OF SURGERY ONLY TAKE THESE MEDICATIONS:   Use albuterol  inhaler on the day of surgery and bring to the hospital.  No Alcohol for 24 hours before or after surgery.  No Smoking including e-cigarettes for 24 hours before surgery.  No chewable tobacco products for at least 6 hours  before surgery.  No nicotine patches on the day of surgery.  Do not use any recreational drugs for at least a week (preferably 2 weeks) before your surgery.  Please be advised that the combination of cocaine and anesthesia may have negative outcomes, up to and including death. If you test positive for cocaine, your surgery will be cancelled.  On the morning of surgery brush your teeth with toothpaste and water, you may rinse your mouth with mouthwash if you wish. Do not swallow any toothpaste or mouthwash.  Use CHG Soap as directed on instruction sheet.  Do not wear jewelry, make-up, hairpins, clips or nail polish.  For welded (permanent) jewelry: bracelets, anklets, waist bands, etc.  Please have this removed prior to surgery.  If it is not removed, there is a chance that hospital personnel will need to cut it off on the day of surgery.  Do not wear lotions, powders, or perfumes.   Do not shave body hair from the neck down 48 hours before surgery.  Contact lenses, hearing aids and dentures may not be worn into surgery.  Do not bring valuables to the hospital. [facility] is not responsible for any missing/lost belongings or valuables.   Notify your doctor if there is any change in your medical condition (cold, fever, infection).  Wear comfortable clothing (specific to your surgery type) to the hospital.  After surgery, you can help prevent lung complications by doing breathing exercises.  Take deep breaths and cough every 1-2  hours. Your doctor may order a device called an Incentive Spirometer to help you take deep breaths. When coughing or sneezing, hold a pillow firmly against your incision with both hands. This is called "splinting." Doing this helps protect your incision. It also decreases belly discomfort.  If you are being discharged the day of surgery, you will not be allowed to drive home. You will need a responsible individual to drive you home and stay with you for 24  hours after surgery.   If you are taking public transportation, you will need to have a responsible individual with you.  Please call the Pre-admissions Testing Dept. at [phone] if you have any questions about these instructions.  Surgery Visitation Policy:  Patients having surgery or a procedure may have two visitors.  Children under the age of 34 must have an adult with them who is not the patient.     Preparing for Surgery with CHLORHEXIDINE  GLUCONATE (CHG) Soap  Chlorhexidine  Gluconate (CHG) Soap  o An antiseptic cleaner that kills germs and bonds with the skin to continue killing germs even after washing  o Used for showering the night before surgery and morning of surgery  Before surgery, you can play an important role by reducing the number of germs on your skin.  CHG (Chlorhexidine  gluconate) soap is an antiseptic cleanser which kills germs and bonds with the skin to continue killing germs even after washing.  Please do not use if you have an allergy to CHG or antibacterial soaps. If your skin becomes reddened/irritated stop using the CHG.  1. Shower the NIGHT BEFORE SURGERY and the MORNING OF SURGERY with CHG soap.  2. If you choose to wash your hair, wash your hair first as usual with your normal shampoo.  3. After shampooing, rinse your hair and body thoroughly to remove the shampoo.  4. Use CHG as you would any other liquid soap. You can apply CHG directly to the skin and wash gently with a scrungie or a clean washcloth.  5. Apply the CHG soap to your body only from the neck down. Do not use on open wounds or open sores. Avoid contact with your eyes, ears, mouth, and genitals (private parts). Wash face and genitals (private parts) with your normal soap.  6. Wash thoroughly, paying special attention to the area where your surgery will be performed.  7. Thoroughly rinse your body with warm water.  8. Do not shower/wash with your normal soap after using and  rinsing off the CHG soap.  9. Pat yourself dry with a clean towel.  10. Wear clean pajamas to bed the night before surgery.  12. Place clean sheets on your bed the night of your first shower and do not sleep with pets.  13. Shower again with the CHG soap on the day of surgery prior to arriving at the hospital.  14. Do not apply any deodorants/lotions/powders.  15. Please wear clean clothes to the hospital.

## 2023-11-20 ENCOUNTER — Encounter
Admission: RE | Admit: 2023-11-20 | Discharge: 2023-11-20 | Disposition: A | Discharge status: Home / Self Care | Payer: Managed Care, Other (non HMO) | Source: Ambulatory Visit | Attending: Obstetrics and Gynecology | Admitting: Obstetrics and Gynecology

## 2023-11-20 DIAGNOSIS — Z01812 Encounter for preprocedural laboratory examination: Secondary | ICD-10-CM | POA: Diagnosis present

## 2023-11-20 DIAGNOSIS — Z3009 Encounter for other general counseling and advice on contraception: Secondary | ICD-10-CM

## 2023-11-20 LAB — CBC
HCT: 42.3 % (ref 36.0–46.0)
Hemoglobin: 13.7 g/dL (ref 12.0–15.0)
MCH: 28.6 pg (ref 26.0–34.0)
MCHC: 32.4 g/dL (ref 30.0–36.0)
MCV: 88.3 fL (ref 80.0–100.0)
Platelets: 319 10*3/uL (ref 150–400)
RBC: 4.79 MIL/uL (ref 3.87–5.11)
RDW: 13.2 % (ref 11.5–15.5)
WBC: 4.8 10*3/uL (ref 4.0–10.5)
nRBC: 0 % (ref 0.0–0.2)

## 2023-11-23 ENCOUNTER — Encounter: Payer: Self-pay | Admitting: Obstetrics and Gynecology

## 2023-11-23 ENCOUNTER — Encounter
Admission: RE | Disposition: A | Discharge status: Home / Self Care | Payer: Self-pay | Source: Home / Self Care | Attending: Obstetrics and Gynecology

## 2023-11-23 ENCOUNTER — Ambulatory Visit
Admission: RE | Admit: 2023-11-23 | Discharge: 2023-11-23 | Disposition: A | Discharge status: Home / Self Care | Payer: Managed Care, Other (non HMO) | Attending: Obstetrics and Gynecology | Admitting: Obstetrics and Gynecology

## 2023-11-23 ENCOUNTER — Other Ambulatory Visit: Payer: Self-pay

## 2023-11-23 ENCOUNTER — Ambulatory Visit: Payer: Managed Care, Other (non HMO)

## 2023-11-23 DIAGNOSIS — Z01812 Encounter for preprocedural laboratory examination: Secondary | ICD-10-CM

## 2023-11-23 DIAGNOSIS — N838 Other noninflammatory disorders of ovary, fallopian tube and broad ligament: Secondary | ICD-10-CM | POA: Diagnosis not present

## 2023-11-23 DIAGNOSIS — F419 Anxiety disorder, unspecified: Secondary | ICD-10-CM | POA: Diagnosis not present

## 2023-11-23 DIAGNOSIS — Z302 Encounter for sterilization: Secondary | ICD-10-CM | POA: Diagnosis present

## 2023-11-23 DIAGNOSIS — Z3009 Encounter for other general counseling and advice on contraception: Secondary | ICD-10-CM

## 2023-11-23 HISTORY — PX: LAPAROSCOPIC TUBAL LIGATION: SHX1937

## 2023-11-23 LAB — POCT PREGNANCY, URINE: Preg Test, Ur: NEGATIVE

## 2023-11-23 LAB — ABO/RH: ABO/RH(D): O NEG

## 2023-11-23 SURGERY — LIGATION, FALLOPIAN TUBE, LAPAROSCOPIC
Anesthesia: General | Site: Pelvis | Laterality: Bilateral

## 2023-11-23 MED ORDER — DEXMEDETOMIDINE HCL IN NACL 80 MCG/20ML IV SOLN
INTRAVENOUS | Status: DC | PRN
  Administered 2023-11-23: 20 ug via INTRAVENOUS

## 2023-11-23 MED ORDER — CHLORHEXIDINE GLUCONATE 0.12 % MT SOLN
OROMUCOSAL | Status: AC
  Filled 2023-11-23: qty 15

## 2023-11-23 MED ORDER — DEXAMETHASONE SODIUM PHOSPHATE 10 MG/ML IJ SOLN
INTRAMUSCULAR | Status: DC | PRN
  Administered 2023-11-23: 10 mg via INTRAVENOUS

## 2023-11-23 MED ORDER — CHLORHEXIDINE GLUCONATE 0.12 % MT SOLN: 15.0000 mL | Freq: Once | OROMUCOSAL | Status: DC

## 2023-11-23 MED ORDER — ONDANSETRON 4 MG PO TBDP: 4.0000 mg | ORAL_TABLET | Freq: Three times a day (TID) | ORAL | 1 refills | Status: AC | PRN

## 2023-11-23 MED ORDER — MIDAZOLAM HCL 2 MG/2ML IJ SOLN
INTRAMUSCULAR | Status: DC | PRN
  Administered 2023-11-23: 2 mg via INTRAVENOUS

## 2023-11-23 MED ORDER — DEXAMETHASONE SODIUM PHOSPHATE 10 MG/ML IJ SOLN
INTRAMUSCULAR | Status: AC
  Filled 2023-11-23: qty 1

## 2023-11-23 MED ORDER — LIDOCAINE HCL (CARDIAC) PF 100 MG/5ML IV SOSY
PREFILLED_SYRINGE | INTRAVENOUS | Status: DC | PRN
  Administered 2023-11-23: 100 mg via INTRAVENOUS

## 2023-11-23 MED ORDER — OXYCODONE HCL 5 MG/5ML PO SOLN: 5.0000 mg | Freq: Once | ORAL | Status: AC | PRN

## 2023-11-23 MED ORDER — DOCUSATE SODIUM 100 MG PO CAPS: 100.0000 mg | ORAL_CAPSULE | Freq: Two times a day (BID) | ORAL | 0 refills | Status: DC

## 2023-11-23 MED ORDER — ACETAMINOPHEN EXTRA STRENGTH 500 MG PO TABS: 1000.0000 mg | ORAL_TABLET | Freq: Four times a day (QID) | ORAL | 0 refills | Status: AC

## 2023-11-23 MED ORDER — KETOROLAC TROMETHAMINE 30 MG/ML IJ SOLN
INTRAMUSCULAR | Status: DC | PRN
  Administered 2023-11-23: 30 mg via INTRAVENOUS

## 2023-11-23 MED ORDER — GABAPENTIN 300 MG PO CAPS: 300.0000 mg | ORAL_CAPSULE | Freq: Every day | ORAL | 0 refills | Status: DC

## 2023-11-23 MED ORDER — ONDANSETRON HCL 4 MG/2ML IJ SOLN
INTRAMUSCULAR | Status: AC
  Filled 2023-11-23: qty 2

## 2023-11-23 MED ORDER — POVIDONE-IODINE 10 % EX SWAB: 2.0000 | Freq: Once | CUTANEOUS | Status: DC

## 2023-11-23 MED ORDER — GABAPENTIN 300 MG PO CAPS
300.0000 mg | ORAL_CAPSULE | ORAL | Status: AC
  Administered 2023-11-23: 300 mg via ORAL

## 2023-11-23 MED ORDER — ONDANSETRON HCL 4 MG/2ML IJ SOLN: 4.0000 mg | Freq: Once | INTRAMUSCULAR | Status: DC | PRN

## 2023-11-23 MED ORDER — ACETAMINOPHEN 10 MG/ML IV SOLN: 1000.0000 mg | Freq: Once | INTRAVENOUS | Status: DC | PRN

## 2023-11-23 MED ORDER — BUPIVACAINE HCL (PF) 0.5 % IJ SOLN
INTRAMUSCULAR | Status: AC
  Filled 2023-11-23: qty 30

## 2023-11-23 MED ORDER — PROPOFOL 1000 MG/100ML IV EMUL
INTRAVENOUS | Status: AC
  Filled 2023-11-23: qty 100

## 2023-11-23 MED ORDER — ACETAMINOPHEN 500 MG PO TABS
1000.0000 mg | ORAL_TABLET | ORAL | Status: AC
  Administered 2023-11-23: 1000 mg via ORAL

## 2023-11-23 MED ORDER — ACETAMINOPHEN 500 MG PO TABS
ORAL_TABLET | ORAL | Status: AC
  Filled 2023-11-23: qty 2

## 2023-11-23 MED ORDER — BUPIVACAINE HCL 0.5 % IJ SOLN
INTRAMUSCULAR | Status: DC | PRN
  Administered 2023-11-23: 15 mL

## 2023-11-23 MED ORDER — MIDAZOLAM HCL 2 MG/2ML IJ SOLN
INTRAMUSCULAR | Status: AC
Start: 2023-11-23 — End: ?
  Filled 2023-11-23: qty 2

## 2023-11-23 MED ORDER — LIDOCAINE HCL (PF) 2 % IJ SOLN
INTRAMUSCULAR | Status: AC
  Filled 2023-11-23: qty 5

## 2023-11-23 MED ORDER — ROCURONIUM BROMIDE 100 MG/10ML IV SOLN
INTRAVENOUS | Status: DC | PRN
  Administered 2023-11-23: 20 mg via INTRAVENOUS
  Administered 2023-11-23: 50 mg via INTRAVENOUS

## 2023-11-23 MED ORDER — FENTANYL CITRATE (PF) 100 MCG/2ML IJ SOLN: 25.0000 ug | INTRAMUSCULAR | Status: DC | PRN

## 2023-11-23 MED ORDER — ONDANSETRON HCL 4 MG/2ML IJ SOLN
INTRAMUSCULAR | Status: DC | PRN
  Administered 2023-11-23: 4 mg via INTRAVENOUS

## 2023-11-23 MED ORDER — OXYCODONE HCL 5 MG PO TABS
ORAL_TABLET | ORAL | Status: AC
  Filled 2023-11-23: qty 1

## 2023-11-23 MED ORDER — 0.9 % SODIUM CHLORIDE (POUR BTL) OPTIME
TOPICAL | Status: DC | PRN
  Administered 2023-11-23: 500 mL

## 2023-11-23 MED ORDER — LACTATED RINGERS IV SOLN: INTRAVENOUS | Status: DC

## 2023-11-23 MED ORDER — SUGAMMADEX SODIUM 200 MG/2ML IV SOLN
INTRAVENOUS | Status: DC | PRN
  Administered 2023-11-23: 200 mg via INTRAVENOUS

## 2023-11-23 MED ORDER — FENTANYL CITRATE (PF) 100 MCG/2ML IJ SOLN
INTRAMUSCULAR | Status: AC
  Filled 2023-11-23: qty 2

## 2023-11-23 MED ORDER — GABAPENTIN 300 MG PO CAPS
ORAL_CAPSULE | ORAL | Status: AC
  Filled 2023-11-23: qty 1

## 2023-11-23 MED ORDER — KETOROLAC TROMETHAMINE 30 MG/ML IJ SOLN
INTRAMUSCULAR | Status: AC
  Filled 2023-11-23: qty 1

## 2023-11-23 MED ORDER — OXYCODONE HCL 5 MG PO TABS
5.0000 mg | ORAL_TABLET | Freq: Once | ORAL | Status: AC | PRN
  Administered 2023-11-23: 5 mg via ORAL

## 2023-11-23 MED ORDER — PROPOFOL 10 MG/ML IV BOLUS
INTRAVENOUS | Status: DC | PRN
  Administered 2023-11-23: 200 ug/kg/min via INTRAVENOUS
  Administered 2023-11-23: 150 ug/kg/min via INTRAVENOUS

## 2023-11-23 MED ORDER — ROCURONIUM BROMIDE 10 MG/ML (PF) SYRINGE
PREFILLED_SYRINGE | INTRAVENOUS | Status: AC
  Filled 2023-11-23: qty 10

## 2023-11-23 MED ORDER — FENTANYL CITRATE (PF) 100 MCG/2ML IJ SOLN
INTRAMUSCULAR | Status: DC | PRN
  Administered 2023-11-23 (×2): 50 ug via INTRAVENOUS

## 2023-11-23 MED ORDER — ORAL CARE MOUTH RINSE: 15.0000 mL | Freq: Once | OROMUCOSAL | Status: DC

## 2023-11-23 SURGICAL SUPPLY — 31 items
BLADE SURG SZ11 CARB STEEL (BLADE) ×1 IMPLANT
CATH ROBINSON RED A/P 16FR (CATHETERS) ×1 IMPLANT
DERMABOND ADVANCED .7 DNX12 (GAUZE/BANDAGES/DRESSINGS) ×1 IMPLANT
DRAPE UTILITY 15X26 TOWEL STRL (DRAPES) ×2 IMPLANT
GAUZE 4X4 16PLY ~~LOC~~+RFID DBL (SPONGE) IMPLANT
GLOVE BIO SURGEON STRL SZ7 (GLOVE) ×2 IMPLANT
GLOVE INDICATOR 7.5 STRL GRN (GLOVE) ×2 IMPLANT
GOWN STRL REUS W/ TWL LRG LVL3 (GOWN DISPOSABLE) ×2 IMPLANT
GRASPER SUT TROCAR 14GX15 (MISCELLANEOUS) IMPLANT
KIT PINK PAD W/HEAD ARE REST (MISCELLANEOUS) ×1
KIT PINK PAD W/HEAD ARM REST (MISCELLANEOUS) ×1 IMPLANT
KIT TURNOVER CYSTO (KITS) ×1 IMPLANT
LABEL OR SOLS (LABEL) ×1 IMPLANT
LIGASURE LAP MARYLAND 5MM 37CM (ELECTROSURGICAL) ×1 IMPLANT
MANIFOLD NEPTUNE II (INSTRUMENTS) ×1 IMPLANT
NS IRRIG 500ML POUR BTL (IV SOLUTION) ×1 IMPLANT
PACK GYN LAPAROSCOPIC (MISCELLANEOUS) ×1 IMPLANT
PAD OB MATERNITY 11 LF (PERSONAL CARE ITEMS) ×1 IMPLANT
PAD PREP OB/GYN DISP 24X41 (PERSONAL CARE ITEMS) ×1 IMPLANT
SCRUB CHG 4% DYNA-HEX 4OZ (MISCELLANEOUS) ×1 IMPLANT
SET TUBE SMOKE EVAC HIGH FLOW (TUBING) ×1 IMPLANT
SLEEVE Z-THREAD 5X100MM (TROCAR) ×2 IMPLANT
SOL PREP PVP 2OZ (MISCELLANEOUS) ×1
SOLUTION PREP PVP 2OZ (MISCELLANEOUS) ×1 IMPLANT
STRIP CLOSURE SKIN 1/4X4 (GAUZE/BANDAGES/DRESSINGS) IMPLANT
SUT MNCRL 4-0 27XMFL (SUTURE) ×2
SUT VIC AB 2-0 UR6 27 (SUTURE) ×1 IMPLANT
SUTURE MNCRL 4-0 27XMF (SUTURE) ×1 IMPLANT
TRAP FLUID SMOKE EVACUATOR (MISCELLANEOUS) ×1 IMPLANT
TROCAR Z-THREAD FIOS 5X100MM (TROCAR) ×1 IMPLANT
WATER STERILE IRR 500ML POUR (IV SOLUTION) ×1 IMPLANT

## 2023-11-23 NOTE — Op Note (Signed)
 Annalyn Blecher 11/23/2023  PREOPERATIVE DIAGNOSIS:  Undesired fertility  POSTOPERATIVE DIAGNOSIS:  Undesired fertility  PROCEDURE:  Laparoscopic Bilateral Tubal Sterilization using Bipolar Coagulation    ANESTHESIA:  General endotracheal  ANESTHESIOLOGIST: Piscitello, Fairy POUR, MD Anesthesiologist: Piscitello, Fairy POUR, MD CRNA: Bonnetta Jimmey SAUNDERS, CRNA  SURGEON: Heather FORBES Penton, MD  COMPLICATIONS:  None immediate.  ESTIMATED BLOOD LOSS:  Less than 20 ml.  FLUIDS: 500 ml LR.  URINE OUTPUT:  200 ml of clear urine.  INDICATIONS: 38 y.o. G1P0010  with undesired fertility, desires permanent sterilization. Other reversible forms of contraception were discussed with patient; she declines all other modalities.  Risks of procedure discussed with patient including permanence of method, bleeding, infection, injury to surrounding organs and need for additional procedures including laparotomy, risk of regret.  Failure risk of 0.5-1% with increased risk of ectopic gestation if pregnancy occurs was also discussed with patient.      FINDINGS:  Normal uterus, tubes, and ovaries. Right lateral posterior fibroid approx 3 cm, subserosal  TECHNIQUE:  The patient was taken to the operating room where general anesthesia was obtained without difficulty.  She was then placed in the dorsal lithotomy position and prepared and draped in sterile fashion.  The bladder was cathed for an estimated amount of clear urine. After an adequate timeout was performed, a bivalved speculum was then placed in the patient's vagina, and the anterior lip of cervix grasped with the single-tooth tenaculum.  The uterine manipulator was then advanced into the uterus.  The speculum was removed from the vagina.   Attention was then turned to the patient's abdomen where a 5-mm skin incision was made in the umbilical fold.  The Optiview 5-mm trocar and sleeve were then advanced without difficulty with the laparoscope under direct  visualization into the abdomen.  The abdomen was then insufflated with carbon dioxide gas and adequate pneumoperitoneum was obtained.  A survey of the patient's pelvis and abdomen revealed entirely normal anatomy.     The fallopian tubes were observed and found to be normal in appearance. A 5mm port was placed in the bilateral lower quadrants under direct visualization. A Ligasure device was then advanced through the operative port and used to coagulate and excise the mid to distal portion of the Fallopian tube, including the fimbriated ends.  Good blanching and coagulation was noted at the site of the application.  There was no bleeding noted in the mesosalpinx.  A similar process was carried out on the right fallopian tube allowing for bilateral tubal sterilization.   Good hemostasis was noted overall.  Local analgesia was drizzled on both operative sites.The instruments were then removed from the patient's abdomen and the skin was closed with 4-0 monocryl and Dermabond.  The uterine manipulator and the tenaculum were removed from the vagina without complications. The patient tolerated the procedure well.  Sponge, lap, and needle counts were correct times two.  The patient was then taken to the recovery room awake, extubated and in stable condition.

## 2023-11-23 NOTE — Interval H&P Note (Signed)
 History and Physical Interval Note:  11/23/2023 12:45 PM  Laura Morton  has presented today for surgery, with the diagnosis of undesired fertility.  The various methods of treatment have been discussed with the patient and family. After consideration of risks, benefits and other options for treatment, the patient has consented to  Procedure(s): LAPAROSCOPIC TUBAL LIGATION (Bilateral) as a surgical intervention.  The patient's history has been reviewed, patient examined, no change in status, stable for surgery.  I have reviewed the patient's chart and labs.  Questions were answered to the patient's satisfaction.     Heather Penton

## 2023-11-23 NOTE — Discharge Instructions (Addendum)
 Laparoscopic Tubal Ligation Discharge Instructions  Laparoscopic tubal ligation and fulguration ties your fallopian tubes to prevent pregnancy in the future.   For the next three days, take ibuprofen  and acetaminophen  on a schedule, every 8 hours. You can take them together or you can intersperse them, and take one every four hours. I also gave you gabapentin  for nighttime, to help you sleep and also to control pain. Take gabapentin  medicines at night for at least the next 3 nights.   Postop constipation is a major cause of pain. Stay well hydrated, walk as you tolerate, and take over the counter senna as well as stool softeners if you need them. Miralax also works well.  RISKS AND COMPLICATIONS  Infection. Bleeding. Injury to surrounding organs. Anesthetic side effects. Failure of the procedure. Risks of future ectopic pregnancy  PROCEDURE  You may be given a medicine to help you relax (sedative) before the procedure. You will be given a medicine to make you sleep (general anesthetic) during the procedure. A tube will be put down your throat to help your breath while under general anesthesia. Two small cuts (incisions) are made in the lower abdominal area and one incision is made near the belly button. Your abdominal area will be inflated with a safe gas (carbon dioxide). This helps give the surgeon room to operate, visualize, and helps the surgeon avoid other organs. A thin, lighted tube (laparoscope) with a camera attached is inserted into your abdomen through the incision near the belly button. Other small instruments may also be inserted through other abdominal incisions. The fallopian tube is located and are removed. After the fallopian tube is removed, the gas is released from the abdomen. The incisions will be closed with stitches (sutures), and Dermabond. A bandage may be placed over the incisions.  AFTER THE PROCEDURE  You will also have some mild abdominal discomfort for 3-7  days. You will be given pain medicine to ease any discomfort. As long as there are no problems, you may be allowed to go home. Someone will need to drive you home and be with you for at least 24 hours once home. You may have some mild discomfort in the throat. This is from the tube placed in your throat while you were sleeping. You may experience discomfort in the shoulder area from some trapped air between the liver and diaphragm. This sensation is normal and will slowly go away on its own.  HOME CARE INSTRUCTIONS  Take all medicines as directed. Only take over-the-counter or prescription medicines for pain, discomfort, or fever as directed by your caregiver. Resume daily activities as directed. Showers are preferred over baths. You may resume sexual activities in 1 week or as directed. Do not drive while taking narcotics.  SEEK MEDICAL CARE IF: . There is increasing abdominal pain. You feel lightheaded or faint. You have the chills. You have an oral temperature above 102 F (38.9 C). There is pus-like (purulent) drainage from any of the wounds. You are unable to pass gas or have a bowel movement. You feel sick to your stomach (nauseous) or throw up (vomit).  MAKE SURE YOU:  Understand these instructions. Will watch your condition. Will get help right away if you are not doing well or get worse.  ExitCare Patient Information 2013 [place], [place].

## 2023-11-23 NOTE — Transfer of Care (Signed)
 Immediate Anesthesia Transfer of Care Note  Patient: Laura Morton  Procedure(s) Performed: LAPAROSCOPIC TUBAL LIGATION (Bilateral: Pelvis)  Patient Location: PACU  Anesthesia Type:General  Level of Consciousness: drowsy  Airway & Oxygen Therapy: Patient Spontanous Breathing  Post-op Assessment: Report given to RN and Post -op Vital signs reviewed and stable  Post vital signs: Reviewed and stable  Last Vitals:  Vitals Value Taken Time  BP 120/80 11/23/23 1439  Temp    Pulse 79 11/23/23 1443  Resp 16 11/23/23 1443  SpO2 98 % 11/23/23 1443  Vitals shown include unfiled device data.  Last Pain:  Vitals:   11/23/23 1010  TempSrc: Temporal  PainSc: 0-No pain      Patients Stated Pain Goal: 0 (11/23/23 1010)  Complications: There were no known notable events for this encounter.

## 2023-11-23 NOTE — Anesthesia Procedure Notes (Signed)
 Procedure Name: Intubation Date/Time: 11/23/2023 1:35 PM  Performed by: Bonnetta Jimmey SAUNDERS, CRNAPre-anesthesia Checklist: Patient identified, Emergency Drugs available, Suction available and Patient being monitored Patient Re-evaluated:Patient Re-evaluated prior to induction Oxygen Delivery Method: Circle system utilized Preoxygenation: Pre-oxygenation with 100% oxygen Induction Type: IV induction Ventilation: Mask ventilation without difficulty Laryngoscope Size: McGrath and 3 Grade View: Grade I Tube type: Oral Tube size: 6.5 mm Number of attempts: 1 Airway Equipment and Method: Stylet and Oral airway Placement Confirmation: ETT inserted through vocal cords under direct vision, positive ETCO2 and breath sounds checked- equal and bilateral Secured at: 21 cm Tube secured with: Tape Dental Injury: Teeth and Oropharynx as per pre-operative assessment

## 2023-11-23 NOTE — Anesthesia Preprocedure Evaluation (Signed)
 Anesthesia Evaluation  Patient identified by MRN, date of birth, ID band Patient awake    Reviewed: Allergy & Precautions, NPO status , Patient's Chart, lab work & pertinent test results  History of Anesthesia Complications Negative for: history of anesthetic complications  Airway Mallampati: I  TM Distance: >3 FB Neck ROM: Full    Dental no notable dental hx. (+) Teeth Intact   Pulmonary asthma , neg sleep apnea, neg COPD, Patient abstained from smoking.Not current smoker   Pulmonary exam normal breath sounds clear to auscultation       Cardiovascular Exercise Tolerance: Good METS(-) hypertension(-) CAD and (-) Past MI negative cardio ROS (-) dysrhythmias  Rhythm:Regular Rate:Normal - Systolic murmurs    Neuro/Psych  PSYCHIATRIC DISORDERS Anxiety Depression    negative neurological ROS     GI/Hepatic ,neg GERD  ,,(+)     (-) substance abuse    Endo/Other  neg diabetes Hyperthyroidism   Renal/GU negative Renal ROS     Musculoskeletal   Abdominal   Peds  Hematology   Anesthesia Other Findings Past Medical History: No date: Anemia No date: Anxiety No date: Asthma No date: Depression No date: Environmental allergies 2010: Hyperthyroidism No date: Knee fracture, left No date: Vertigo No date: Vitamin D  deficiency  Reproductive/Obstetrics                             Anesthesia Physical Anesthesia Plan  ASA: 2  Anesthesia Plan: General   Post-op Pain Management: Toradol  IV (intra-op)*, Tylenol  PO (pre-op)* and Gabapentin  PO (pre-op)*   Induction: Intravenous  PONV Risk Score and Plan: 4 or greater and Ondansetron , Dexamethasone , Midazolam , TIVA and Propofol  infusion  Airway Management Planned: Oral ETT and Video Laryngoscope Planned  Additional Equipment: None  Intra-op Plan:   Post-operative Plan: Extubation in OR  Informed Consent: I have reviewed the patients History  and Physical, chart, labs and discussed the procedure including the risks, benefits and alternatives for the proposed anesthesia with the patient or authorized representative who has indicated his/her understanding and acceptance.     Dental advisory given  Plan Discussed with: CRNA and Surgeon  Anesthesia Plan Comments: (Discussed risks of anesthesia with patient, including PONV, sore throat, lip/dental/eye damage. Rare risks discussed as well, such as cardiorespiratory and neurological sequelae, and allergic reactions. Discussed the role of CRNA in patient's perioperative care. Patient understands.)       Anesthesia Quick Evaluation

## 2023-11-24 ENCOUNTER — Encounter: Payer: Self-pay | Admitting: Obstetrics and Gynecology

## 2023-11-24 LAB — TYPE AND SCREEN
ABO/RH(D): O NEG
Antibody Screen: NEGATIVE

## 2023-11-25 NOTE — Anesthesia Postprocedure Evaluation (Signed)
 Anesthesia Post Note  Patient: Clarece Drzewiecki  Procedure(s) Performed: LAPAROSCOPIC TUBAL LIGATION (Bilateral: Pelvis)  Patient location during evaluation: PACU Anesthesia Type: General Level of consciousness: awake and alert Pain management: pain level controlled Vital Signs Assessment: post-procedure vital signs reviewed and stable Respiratory status: spontaneous breathing, nonlabored ventilation, respiratory function stable and patient connected to nasal cannula oxygen Cardiovascular status: blood pressure returned to baseline and stable Postop Assessment: no apparent nausea or vomiting Anesthetic complications: no   There were no known notable events for this encounter.   Last Vitals:  Vitals:   11/23/23 1515 11/23/23 1530  BP: 117/79 133/82  Pulse: 65 62  Resp: 11 16  Temp: 36.6 C 36.5 C  SpO2: 99% 100%    Last Pain:  Vitals:   11/23/23 1530  TempSrc: Temporal  PainSc: Asleep                 Fairy POUR Mekiah [place]

## 2023-11-26 LAB — SURGICAL PATHOLOGY

## 2023-12-10 DIAGNOSIS — Z3009 Encounter for other general counseling and advice on contraception: Secondary | ICD-10-CM | POA: Diagnosis not present

## 2024-01-16 ENCOUNTER — Telehealth: Payer: Self-pay

## 2024-01-16 DIAGNOSIS — E663 Overweight: Secondary | ICD-10-CM

## 2024-01-17 ENCOUNTER — Ambulatory Visit: Payer: Managed Care, Other (non HMO) | Admitting: Nurse Practitioner

## 2024-01-17 MED ORDER — PHENTERMINE HCL 37.5 MG PO TABS: 37.5000 mg | ORAL_TABLET | Freq: Every day | ORAL | 0 refills | Status: DC

## 2024-01-17 NOTE — Telephone Encounter (Signed)
 Try to call pt unable to leave message sent MyChart message

## 2024-01-27 ENCOUNTER — Other Ambulatory Visit: Payer: Self-pay | Admitting: Nurse Practitioner

## 2024-01-27 DIAGNOSIS — B351 Tinea unguium: Secondary | ICD-10-CM

## 2024-01-28 NOTE — Telephone Encounter (Signed)
 Pt had appt thursday

## 2024-01-31 ENCOUNTER — Other Ambulatory Visit
Admission: RE | Admit: 2024-01-31 | Discharge: 2024-01-31 | Disposition: A | Discharge status: Home / Self Care | Attending: Nurse Practitioner | Admitting: Nurse Practitioner

## 2024-01-31 ENCOUNTER — Ambulatory Visit (INDEPENDENT_AMBULATORY_CARE_PROVIDER_SITE_OTHER): Admitting: Nurse Practitioner

## 2024-01-31 ENCOUNTER — Encounter: Payer: Self-pay | Admitting: Nurse Practitioner

## 2024-01-31 VITALS — BP 130/80 | HR 107 | Temp 98.5°F | Resp 16 | Ht 62.0 in | Wt 115.4 lb

## 2024-01-31 DIAGNOSIS — Z6825 Body mass index (BMI) 25.0-25.9, adult: Secondary | ICD-10-CM

## 2024-01-31 DIAGNOSIS — Z79899 Other long term (current) drug therapy: Secondary | ICD-10-CM | POA: Insufficient documentation

## 2024-01-31 DIAGNOSIS — E663 Overweight: Secondary | ICD-10-CM

## 2024-01-31 DIAGNOSIS — Z113 Encounter for screening for infections with a predominantly sexual mode of transmission: Secondary | ICD-10-CM

## 2024-01-31 DIAGNOSIS — D508 Other iron deficiency anemias: Secondary | ICD-10-CM | POA: Diagnosis not present

## 2024-01-31 DIAGNOSIS — B351 Tinea unguium: Secondary | ICD-10-CM | POA: Diagnosis not present

## 2024-01-31 LAB — HEPATIC FUNCTION PANEL
ALT: 14 U/L (ref 0–44)
AST: 16 U/L (ref 15–41)
Albumin: 3.7 g/dL (ref 3.5–5.0)
Alkaline Phosphatase: 39 U/L (ref 38–126)
Bilirubin, Direct: <0.1 mg/dL (ref 0.0–0.2)
Total Bilirubin: 0.6 mg/dL (ref 0.0–1.2)
Total Protein: 6.6 g/dL (ref 6.5–8.1)

## 2024-01-31 MED ORDER — PHENTERMINE HCL 37.5 MG PO TABS: 37.5000 mg | ORAL_TABLET | Freq: Every day | ORAL | 1 refills | Status: DC

## 2024-01-31 NOTE — Progress Notes (Signed)
 [facility] [address] [place], [place] [address]  Internal MEDICINE  Office Visit Note  Patient Name: Laura [place]  [id number]  [id number]  Date of Service: 01/31/2024  Chief Complaint  Patient presents with   Depression   Follow-up    Weight loss     HPI Laura [place] presents for a follow-up visit for weight loss, STD testing and anemia.  Weight loss -- decreased weight by 7 more lbs, has reached weight loss goal of 115 lbs as of today.  Psychosocial stressors. -- ended relationship with her significant other and moved to [facility]. Still working her same job which she enjoys. Her S/O was cheating on her. Today she is requesting urine and blood testing for STDs.  Anemia -- stable, taking iron  supplement.     Current Medication: Outpatient Encounter Medications as of 01/31/2024  Medication Sig   albuterol  (VENTOLIN  HFA) 108 (90 Base) MCG/ACT inhaler Inhale 1-2 puffs into the lungs every 6 (six) hours as needed for wheezing or shortness of breath.   docusate sodium  (COLACE) 100 MG capsule Take 1 capsule (100 mg total) by mouth 2 (two) times daily. To keep stools soft   Ferrous Sulfate (IRON  PO) Take 1 tablet by mouth daily.   fluconazole  (DIFLUCAN ) 150 MG tablet Take 1 tablet once monthly by mouth at onset of symptom prior to start of cycle.   Multiple Vitamin (MULTIVITAMIN) tablet Take 1 tablet by mouth daily.   phentermine  (ADIPEX-P ) 37.5 MG tablet Take 1 tablet (37.5 mg total) by mouth daily before breakfast.   Spacer/Aero-Holding Chambers (AEROCHAMBER PLUS) inhaler Use as instructed   terbinafine  (LAMISIL ) 250 MG tablet Take 1 tablet (250 mg total) by mouth daily. (Patient taking differently: Take 250 mg by mouth at bedtime.)   gabapentin  (NEURONTIN ) 300 MG capsule Take 1 capsule (300 mg total) by mouth at bedtime for 14 days. Take nightly for three days after surgery, and then as needed for pain for 14 days.   No facility-administered encounter medications on file as of  01/31/2024.    Surgical History: Past Surgical History:  Procedure Laterality Date   BREAST BIOPSY Left 09/27/2022   us  biopsy/ coil clip/ path pending   BREAST BIOPSY Right 09/27/2022   us  biopsy/ heart clip/ path pending   BREAST BIOPSY Right 09/27/2022   US  RT BREAST BX W LOC DEV 1ST LESION IMG BX SPEC US  GUIDE 09/27/2022 [facility]   BREAST BIOPSY Left 09/27/2022   US  LT BREAST BX W LOC DEV 1ST LESION IMG BX SPEC US  GUIDE 09/27/2022 [facility]   LAPAROSCOPIC TUBAL LIGATION Bilateral 11/23/2023   Procedure: LAPAROSCOPIC TUBAL LIGATION;  Surgeon: Prescilla Brod, MD;  Location: [facility];  Service: Gynecology;  Laterality: Bilateral;    Medical History: Past Medical History:  Diagnosis Date   Anemia    Anxiety    Asthma    Depression    Environmental allergies    Hyperthyroidism 2010   Knee fracture, left    Vertigo    Vitamin D  deficiency     Family History: Family History  Problem Relation Age of Onset   Diabetes Mother    Lupus Mother    Hypertension Mother    Hypertension Father    Heart disease Sister    Spina bifida Sister     Social History   Socioeconomic History   Marital status: Significant Other    Spouse name: [place]   Number of children: 0   Years of education: Not on file  Highest education level: Not on file  Occupational History   Not on file  Tobacco Use   Smoking status: Never   Smokeless tobacco: Never  Vaping Use   Vaping status: Never Used  Substance and Sexual Activity   Alcohol use: Yes    Comment: on occassion   Drug use: No   Sexual activity: Yes    Partners: Male  Other Topics Concern   Not on file  Social History Narrative   Not on file   Social Drivers of Health   Financial Resource Strain: Not on file  Food Insecurity: Not on file  Transportation Needs: Not on file  Physical Activity: Not on file  Stress: Not on file  Social Connections: Not on file  Intimate Partner Violence: Not on file       Review of Systems  Constitutional:  Positive for appetite change, fatigue and unexpected weight change. Negative for chills.  HENT:  Negative for congestion, postnasal drip, rhinorrhea, sneezing and sore throat.   Eyes:  Negative for redness.  Respiratory: Negative.  Negative for cough, chest tightness, shortness of breath and wheezing.   Cardiovascular: Negative.  Negative for chest pain and palpitations.  Gastrointestinal: Negative.  Negative for abdominal pain, constipation, diarrhea, nausea and vomiting.  Endocrine: Positive for cold intolerance.  Genitourinary:  Negative for dysuria and frequency.  Musculoskeletal: Negative.  Negative for arthralgias, back pain, joint swelling and neck pain.  Skin:  Negative for rash.  Neurological: Negative.  Negative for tremors and numbness.  Hematological:  Negative for adenopathy. Bruises/bleeds easily.  Psychiatric/Behavioral:  Negative for behavioral problems (Depression), sleep disturbance and suicidal ideas. The patient is not nervous/anxious.     Vital Signs: BP 130/80   Pulse (!) 107   Temp 98.5 F (36.9 C)   Resp 16   Ht 5\' 2"  (1.575 m)   Wt 115 lb 6.4 oz (52.3 kg)   SpO2 98%   BMI 21.11 kg/m    Physical Exam Vitals reviewed.  Constitutional:      General: She is not in acute distress.    Appearance: Normal appearance. She is normal weight. She is not ill-appearing.  HENT:     Head: Normocephalic and atraumatic.  Eyes:     Pupils: Pupils are equal, round, and reactive to light.  Cardiovascular:     Rate and Rhythm: Normal rate and regular rhythm.  Pulmonary:     Effort: Pulmonary effort is normal. No respiratory distress.  Neurological:     Mental Status: She is alert and oriented to person, place, and time.  Psychiatric:        Mood and Affect: Mood normal.        Behavior: Behavior normal.        Assessment/Plan: 1. Other iron  deficiency anemia (Primary) Stable, continue iron  supplement.   2.  Onychomycosis of toenail Improving, 12 week course of terbinafine  has been completed. Liver function will be rechecked  3. Overweight with body mass index (BMI) of 25 to 25.9 in adult Continue phentermine  for 2 months, will discuss starting to wean off soon.  - phentermine  (ADIPEX-P ) 37.5 MG tablet; Take 1 tablet (37.5 mg total) by mouth daily before breakfast.  Dispense: 30 tablet; Refill: 1  4. High risk medication use Liver function testing ordered to recheck after finishing 12 week course of terbinafine  - Hepatic function panel  5. Screen for STD (sexually transmitted disease) STD testing ordered per patient request  - STI Profile - Chlamydia/Gonococcus/Trichomonas, NAA  General Counseling: charolett aime understanding of the findings of todays visit and agrees with plan of treatment. I have discussed any further diagnostic evaluation that may be needed or ordered today. We also reviewed her medications today. she has been encouraged to call the office with any questions or concerns that should arise related to todays visit.    Orders Placed This Encounter  Procedures   Hepatic function panel    No orders of the defined types were placed in this encounter.   Return in about 8 weeks (around 03/27/2024) for F/U, Weight loss, Celines Femia PCP.   Total time spent:30 Minutes Time spent includes review of chart, medications, test results, and follow up plan with the patient.   [place] Controlled Substance Database was reviewed by me.  This patient was seen by Laurence Pons, FNP-C in collaboration with Dr. Verneta Gone as a part of collaborative care agreement.   Mamoudou Mulvehill R. Bobbi Burow, MSN, FNP-C Internal medicine

## 2024-02-02 LAB — MISC LABCORP TEST (SEND OUT): Labcorp test code: 144011

## 2024-02-04 LAB — CHLAMYDIA/GONOCOCCUS/TRICHOMONAS, NAA
Chlamydia by NAA: NEGATIVE
Gonococcus by NAA: NEGATIVE
Trich vag by NAA: NEGATIVE

## 2024-02-04 NOTE — Progress Notes (Signed)
 Urine is negative for trichomonas, gonorrhea and chlamydia.

## 2024-02-04 NOTE — Progress Notes (Signed)
 Liver function is normal. Negative for hepatitis B, hepatitis C, HIV, and syphilis. The rest of the labs are pending.

## 2024-03-10 ENCOUNTER — Encounter: Payer: Self-pay | Admitting: Nurse Practitioner

## 2024-03-27 ENCOUNTER — Ambulatory Visit (INDEPENDENT_AMBULATORY_CARE_PROVIDER_SITE_OTHER): Admitting: Nurse Practitioner

## 2024-03-27 ENCOUNTER — Encounter: Payer: Self-pay | Admitting: Nurse Practitioner

## 2024-03-27 VITALS — BP 134/86 | HR 88 | Temp 98.6°F | Resp 16 | Ht 62.0 in | Wt 115.6 lb

## 2024-03-27 DIAGNOSIS — B351 Tinea unguium: Secondary | ICD-10-CM | POA: Diagnosis not present

## 2024-03-27 DIAGNOSIS — Z6825 Body mass index (BMI) 25.0-25.9, adult: Secondary | ICD-10-CM | POA: Diagnosis not present

## 2024-03-27 DIAGNOSIS — D508 Other iron deficiency anemias: Secondary | ICD-10-CM | POA: Diagnosis not present

## 2024-03-27 DIAGNOSIS — E663 Overweight: Secondary | ICD-10-CM

## 2024-03-27 DIAGNOSIS — Z79899 Other long term (current) drug therapy: Secondary | ICD-10-CM

## 2024-03-27 DIAGNOSIS — R233 Spontaneous ecchymoses: Secondary | ICD-10-CM

## 2024-03-27 MED ORDER — PHENTERMINE HCL 37.5 MG PO TABS: 37.5000 mg | ORAL_TABLET | Freq: Every day | ORAL | 1 refills | Status: DC

## 2024-03-27 NOTE — Progress Notes (Signed)
 [facility] [address] [place], [place] [address]  Internal MEDICINE  Office Visit Note  Patient Name: Laura [place]  [id number]  [id number]  Date of Service: 03/27/2024  Chief Complaint  Patient presents with   Depression   Follow-up    HPI Charish presents for a follow-up visit for weight loss, anemia, labs, and urine specimen results.  Weight loss -- maintaining at 115 lbs.  Anemia -- stable at this time.  Onychomycosis of toenail -- finished treatment, liver function labs are normal  Urine was negative for chlamydia, gonorrhea and trichomonas. And blood draw was negative for HIV, RPR, HBV and HCV. Easy bruising -- scattered to forearm, bilateral legs, different stags of healing, not sure where most of the bruising come from, and most of the bruises are small in size. Has history of anemia due to iron  deficiency, has been stable since taking regular iron  supplement.     Current Medication: Outpatient Encounter Medications as of 03/27/2024  Medication Sig   albuterol  (VENTOLIN  HFA) 108 (90 Base) MCG/ACT inhaler Inhale 1-2 puffs into the lungs every 6 (six) hours as needed for wheezing or shortness of breath.   docusate sodium  (COLACE) 100 MG capsule Take 1 capsule (100 mg total) by mouth 2 (two) times daily. To keep stools soft   Ferrous Sulfate (IRON  PO) Take 1 tablet by mouth daily.   fluconazole  (DIFLUCAN ) 150 MG tablet Take 1 tablet once monthly by mouth at onset of symptom prior to start of cycle.   Multiple Vitamin (MULTIVITAMIN) tablet Take 1 tablet by mouth daily.   Spacer/Aero-Holding Chambers (AEROCHAMBER PLUS) inhaler Use as instructed   [DISCONTINUED] phentermine  (ADIPEX-P ) 37.5 MG tablet Take 1 tablet (37.5 mg total) by mouth daily before breakfast.   [DISCONTINUED] terbinafine  (LAMISIL ) 250 MG tablet Take 1 tablet (250 mg total) by mouth daily. (Patient taking differently: Take 250 mg by mouth at bedtime.)   gabapentin  (NEURONTIN ) 300 MG capsule Take 1  capsule (300 mg total) by mouth at bedtime for 14 days. Take nightly for three days after surgery, and then as needed for pain for 14 days.   phentermine  (ADIPEX-P ) 37.5 MG tablet Take 1 tablet (37.5 mg total) by mouth daily before breakfast.   No facility-administered encounter medications on file as of 03/27/2024.    Surgical History: Past Surgical History:  Procedure Laterality Date   BREAST BIOPSY Left 09/27/2022   us  biopsy/ coil clip/ path pending   BREAST BIOPSY Right 09/27/2022   us  biopsy/ heart clip/ path pending   BREAST BIOPSY Right 09/27/2022   US  RT BREAST BX W LOC DEV 1ST LESION IMG BX SPEC US  GUIDE 09/27/2022 [facility]   BREAST BIOPSY Left 09/27/2022   US  LT BREAST BX W LOC DEV 1ST LESION IMG BX SPEC US  GUIDE 09/27/2022 [facility]   LAPAROSCOPIC TUBAL LIGATION Bilateral 11/23/2023   Procedure: LAPAROSCOPIC TUBAL LIGATION;  Surgeon: Prescilla Brod, MD;  Location: [facility];  Service: Gynecology;  Laterality: Bilateral;    Medical History: Past Medical History:  Diagnosis Date   Anemia    Anxiety    Asthma    Depression    Environmental allergies    Hyperthyroidism 2010   Knee fracture, left    Vertigo    Vitamin D  deficiency     Family History: Family History  Problem Relation Age of Onset   Diabetes Mother    Lupus Mother    Hypertension Mother    Hypertension Father    Heart disease Sister  Spina bifida Sister     Social History   Socioeconomic History   Marital status: Significant Other    Spouse name: Brunilda Capra   Number of children: 0   Years of education: Not on file   Highest education level: Not on file  Occupational History   Not on file  Tobacco Use   Smoking status: Never   Smokeless tobacco: Never  Vaping Use   Vaping status: Never Used  Substance and Sexual Activity   Alcohol use: Not Currently    Comment: on occassion   Drug use: No   Sexual activity: Yes    Partners: Male  Other Topics Concern   Not on  file  Social History Narrative   Not on file   Social Drivers of Health   Financial Resource Strain: Not on file  Food Insecurity: Not on file  Transportation Needs: Not on file  Physical Activity: Not on file  Stress: Not on file  Social Connections: Not on file  Intimate Partner Violence: Not on file      Review of Systems  Constitutional:  Positive for appetite change, fatigue and unexpected weight change. Negative for chills.  HENT:  Negative for congestion, postnasal drip, rhinorrhea, sneezing and sore throat.   Eyes:  Negative for redness.  Respiratory: Negative.  Negative for cough, chest tightness, shortness of breath and wheezing.   Cardiovascular: Negative.  Negative for chest pain and palpitations.  Gastrointestinal: Negative.  Negative for abdominal pain, constipation, diarrhea, nausea and vomiting.  Endocrine: Positive for cold intolerance.  Genitourinary:  Negative for dysuria and frequency.  Musculoskeletal: Negative.  Negative for arthralgias, back pain, joint swelling and neck pain.  Skin:  Negative for rash.  Neurological: Negative.  Negative for tremors and numbness.  Hematological:  Negative for adenopathy. Bruises/bleeds easily.  Psychiatric/Behavioral:  Negative for behavioral problems (Depression), sleep disturbance and suicidal ideas. The patient is not nervous/anxious.     Vital Signs: BP 134/86   Pulse 88   Temp 98.6 F (37 C)   Resp 16   Ht 5\' 2"  (1.575 m)   Wt 115 lb 9.6 oz (52.4 kg)   SpO2 99%   BMI 21.14 kg/m    Physical Exam Vitals reviewed.  Constitutional:      General: She is not in acute distress.    Appearance: Normal appearance. She is normal weight. She is not ill-appearing.  HENT:     Head: Normocephalic and atraumatic.  Eyes:     Pupils: Pupils are equal, round, and reactive to light.  Cardiovascular:     Rate and Rhythm: Normal rate and regular rhythm.  Pulmonary:     Effort: Pulmonary effort is normal. No respiratory  distress.  Neurological:     Mental Status: She is alert and oriented to person, place, and time.  Psychiatric:        Mood and Affect: Mood normal.        Behavior: Behavior normal.        Assessment/Plan: 1. Other iron  deficiency anemia (Primary) Most recent labs are stable.   2. Easy bruising Labs are stable. Continue to monitor  3. Onychomycosis of toenail Resolved   4. Overweight with body mass index (BMI) of 25 to 25.9 in adult Continue phentermine  as prescribed. Follow up in 8 weeks  - phentermine  (ADIPEX-P ) 37.5 MG tablet; Take 1 tablet (37.5 mg total) by mouth daily before breakfast.  Dispense: 30 tablet; Refill: 1  5. High risk medication use Liver function  panel is normal      General Counseling: adya wirz understanding of the findings of todays visit and agrees with plan of treatment. I have discussed any further diagnostic evaluation that may be needed or ordered today. We also reviewed her medications today. she has been encouraged to call the office with any questions or concerns that should arise related to todays visit.    No orders of the defined types were placed in this encounter.   Meds ordered this encounter  Medications   phentermine  (ADIPEX-P ) 37.5 MG tablet    Sig: Take 1 tablet (37.5 mg total) by mouth daily before breakfast.    Dispense:  30 tablet    Refill:  1    Will bring goodrx coupon, do not run through insurance.    Return in about 8 weeks (around 05/22/2024) for F/U, Chanel Mckesson PCP refill .   Total time spent:30 Minutes Time spent includes review of chart, medications, test results, and follow up plan with the patient.   [place] Controlled Substance Database was reviewed by me.  This patient was seen by Laurence Pons, FNP-C in collaboration with Dr. Verneta Gone as a part of collaborative care agreement.   Yentl Verge R. Bobbi Burow, MSN, FNP-C Internal medicine

## 2024-04-12 ENCOUNTER — Encounter: Payer: Self-pay | Admitting: Nurse Practitioner

## 2024-04-21 ENCOUNTER — Encounter: Payer: Self-pay | Admitting: Nurse Practitioner

## 2024-05-22 ENCOUNTER — Encounter: Payer: Self-pay | Admitting: Nurse Practitioner

## 2024-05-22 ENCOUNTER — Ambulatory Visit (INDEPENDENT_AMBULATORY_CARE_PROVIDER_SITE_OTHER): Admitting: Nurse Practitioner

## 2024-05-22 VITALS — BP 118/82 | HR 93 | Temp 98.3°F | Resp 16 | Ht 62.0 in | Wt 115.6 lb

## 2024-05-22 DIAGNOSIS — Z6825 Body mass index (BMI) 25.0-25.9, adult: Secondary | ICD-10-CM

## 2024-05-22 DIAGNOSIS — B351 Tinea unguium: Secondary | ICD-10-CM | POA: Diagnosis not present

## 2024-05-22 DIAGNOSIS — F419 Anxiety disorder, unspecified: Secondary | ICD-10-CM | POA: Diagnosis not present

## 2024-05-22 DIAGNOSIS — E663 Overweight: Secondary | ICD-10-CM | POA: Diagnosis not present

## 2024-05-22 MED ORDER — TERBINAFINE HCL 250 MG PO TABS: 250.0000 mg | ORAL_TABLET | Freq: Every day | ORAL | 0 refills | Status: AC

## 2024-05-22 MED ORDER — PHENTERMINE HCL 37.5 MG PO TABS: 18.2500 mg | ORAL_TABLET | Freq: Every day | ORAL | 1 refills | Status: DC

## 2024-05-22 NOTE — Progress Notes (Signed)
 [facility] [address] [place], [place] [address]  Internal MEDICINE  Office Visit Note  Patient Name: Laura [place]  [id number]  [id number]  Date of Service: 05/22/2024  Chief Complaint  Patient presents with   Depression   Follow-up    HPI Diamon presents for a follow-up visit for onychomycosis, weight loss, anemia, anxiety.   Weight is no change, remains at goal weight  Wants to decrease dose of phentermine   Fungal nail infection of 3 toes now, recently lost the toenail on the 2nd toe of the left foot.  Anxiety is manageable.     Current Medication: Outpatient Encounter Medications as of 05/22/2024  Medication Sig   terbinafine  (LAMISIL ) 250 MG tablet Take 1 tablet (250 mg total) by mouth daily.   albuterol  (VENTOLIN  HFA) 108 (90 Base) MCG/ACT inhaler Inhale 1-2 puffs into the lungs every 6 (six) hours as needed for wheezing or shortness of breath.   docusate sodium  (COLACE) 100 MG capsule Take 1 capsule (100 mg total) by mouth 2 (two) times daily. To keep stools soft   Ferrous Sulfate (IRON  PO) Take 1 tablet by mouth daily.   fluconazole  (DIFLUCAN ) 150 MG tablet Take 1 tablet once monthly by mouth at onset of symptom prior to start of cycle.   gabapentin  (NEURONTIN ) 300 MG capsule Take 1 capsule (300 mg total) by mouth at bedtime for 14 days. Take nightly for three days after surgery, and then as needed for pain for 14 days.   Multiple Vitamin (MULTIVITAMIN) tablet Take 1 tablet by mouth daily.   phentermine  (ADIPEX-P ) 37.5 MG tablet Take 0.5 tablets (18.75 mg total) by mouth daily before breakfast.   Spacer/Aero-Holding Chambers (AEROCHAMBER PLUS) inhaler Use as instructed   [DISCONTINUED] phentermine  (ADIPEX-P ) 37.5 MG tablet Take 1 tablet (37.5 mg total) by mouth daily before breakfast.   No facility-administered encounter medications on file as of 05/22/2024.    Surgical History: Past Surgical History:  Procedure Laterality Date   BREAST BIOPSY Left  09/27/2022   us  biopsy/ coil clip/ path pending   BREAST BIOPSY Right 09/27/2022   us  biopsy/ heart clip/ path pending   BREAST BIOPSY Right 09/27/2022   US  RT BREAST BX W LOC DEV 1ST LESION IMG BX SPEC US  GUIDE 09/27/2022 [facility]   BREAST BIOPSY Left 09/27/2022   US  LT BREAST BX W LOC DEV 1ST LESION IMG BX SPEC US  GUIDE 09/27/2022 [facility]   LAPAROSCOPIC TUBAL LIGATION Bilateral 11/23/2023   Procedure: LAPAROSCOPIC TUBAL LIGATION;  Surgeon: Verdon Keen, MD;  Location: [facility];  Service: Gynecology;  Laterality: Bilateral;    Medical History: Past Medical History:  Diagnosis Date   Anemia    Anxiety    Asthma    Depression    Environmental allergies    Hyperthyroidism 2010   Knee fracture, left    Vertigo    Vitamin D  deficiency     Family History: Family History  Problem Relation Age of Onset   Diabetes Mother    Lupus Mother    Hypertension Mother    Hypertension Father    Heart disease Sister    Spina bifida Sister     Social History   Socioeconomic History   Marital status: Significant Other    Spouse name: Jerona Pizza   Number of children: 0   Years of education: Not on file   Highest education level: Not on file  Occupational History   Not on file  Tobacco Use   Smoking status: Never  Smokeless tobacco: Never  Vaping Use   Vaping status: Never Used  Substance and Sexual Activity   Alcohol use: Not Currently    Comment: on occassion   Drug use: No   Sexual activity: Yes    Partners: Male  Other Topics Concern   Not on file  Social History Narrative   Not on file   Social Drivers of Health   Financial Resource Strain: Not on file  Food Insecurity: Not on file  Transportation Needs: Not on file  Physical Activity: Not on file  Stress: Not on file  Social Connections: Not on file  Intimate Partner Violence: Not on file      Review of Systems  Constitutional:  Positive for appetite change, fatigue and unexpected  weight change. Negative for chills.  HENT:  Negative for congestion, postnasal drip, rhinorrhea, sneezing and sore throat.   Eyes:  Negative for redness.  Respiratory: Negative.  Negative for cough, chest tightness, shortness of breath and wheezing.   Cardiovascular: Negative.  Negative for chest pain and palpitations.  Gastrointestinal: Negative.  Negative for abdominal pain, constipation, diarrhea, nausea and vomiting.  Endocrine: Positive for cold intolerance.  Genitourinary:  Negative for dysuria and frequency.  Musculoskeletal: Negative.  Negative for arthralgias, back pain, joint swelling and neck pain.  Skin:  Negative for rash.  Neurological: Negative.  Negative for tremors and numbness.  Hematological:  Negative for adenopathy. Bruises/bleeds easily.  Psychiatric/Behavioral:  Negative for behavioral problems (Depression), sleep disturbance and suicidal ideas. The patient is not nervous/anxious.     Vital Signs: BP 118/82   Pulse 93   Temp 98.3 F (36.8 C)   Resp 16   Ht 5' 2 (1.575 m)   Wt 115 lb 9.6 oz (52.4 kg)   SpO2 98%   BMI 21.14 kg/m    Physical Exam Vitals reviewed.  Constitutional:      General: She is not in acute distress.    Appearance: Normal appearance. She is normal weight. She is not ill-appearing.  HENT:     Head: Normocephalic and atraumatic.  Eyes:     Pupils: Pupils are equal, round, and reactive to light.  Cardiovascular:     Rate and Rhythm: Normal rate and regular rhythm.  Pulmonary:     Effort: Pulmonary effort is normal. No respiratory distress.  Neurological:     Mental Status: She is alert and oriented to person, place, and time.  Psychiatric:        Mood and Affect: Mood normal.        Behavior: Behavior normal.        Assessment/Plan: 1. Onychomycosis of toenail (Primary) Take terbinafine  for another course of 12 weeks. Then will repeat liver function labs  - terbinafine  (LAMISIL ) 250 MG tablet; Take 1 tablet (250 mg total)  by mouth daily.  Dispense: 90 tablet; Refill: 0  2. Overweight with body mass index (BMI) of 25 to 25.9 in adult Continue phentermine  at 1/2 tablet daily.  - phentermine  (ADIPEX-P ) 37.5 MG tablet; Take 0.5 tablets (18.75 mg total) by mouth daily before breakfast.  Dispense: 15 tablet; Refill: 1  3. Anxiety Stable, no issues.    General Counseling: loxley cibrian understanding of the findings of todays visit and agrees with plan of treatment. I have discussed any further diagnostic evaluation that may be needed or ordered today. We also reviewed her medications today. she has been encouraged to call the office with any questions or concerns that should arise related to todays  visit.    No orders of the defined types were placed in this encounter.   Meds ordered this encounter  Medications   phentermine  (ADIPEX-P ) 37.5 MG tablet    Sig: Take 0.5 tablets (18.75 mg total) by mouth daily before breakfast.    Dispense:  15 tablet    Refill:  1    Will bring goodrx coupon, do not run through insurance.   terbinafine  (LAMISIL ) 250 MG tablet    Sig: Take 1 tablet (250 mg total) by mouth daily.    Dispense:  90 tablet    Refill:  0    Fill new script today    Return in about 8 weeks (around 07/17/2024) for F/U, Zahirah Cheslock PCP, eval new med.   Total time spent:30 Minutes Time spent includes review of chart, medications, test results, and follow up plan with the patient.   [place] Controlled Substance Database was reviewed by me.  This patient was seen by Mardy Maxin, FNP-C in collaboration with Dr. Sigrid Bathe as a part of collaborative care agreement.   Aleigha Gilani R. Maxin, MSN, FNP-C Internal medicine

## 2024-05-25 ENCOUNTER — Encounter: Payer: Self-pay | Admitting: Nurse Practitioner

## 2024-07-17 ENCOUNTER — Telehealth: Payer: Self-pay | Admitting: Nurse Practitioner

## 2024-07-17 NOTE — Telephone Encounter (Signed)
 Left vm and sent mychart message to confirm 07/24/24 appointment-Toni

## 2024-07-22 ENCOUNTER — Encounter: Payer: Managed Care, Other (non HMO) | Admitting: Nurse Practitioner

## 2024-07-24 ENCOUNTER — Ambulatory Visit (INDEPENDENT_AMBULATORY_CARE_PROVIDER_SITE_OTHER): Admitting: Nurse Practitioner

## 2024-07-24 ENCOUNTER — Encounter: Payer: Self-pay | Admitting: Nurse Practitioner

## 2024-07-24 VITALS — BP 116/84 | HR 95 | Temp 98.3°F | Resp 16 | Ht 62.0 in | Wt 120.0 lb

## 2024-07-24 DIAGNOSIS — I8390 Asymptomatic varicose veins of unspecified lower extremity: Secondary | ICD-10-CM

## 2024-07-24 DIAGNOSIS — I879 Disorder of vein, unspecified: Secondary | ICD-10-CM | POA: Diagnosis not present

## 2024-07-24 DIAGNOSIS — Z0001 Encounter for general adult medical examination with abnormal findings: Secondary | ICD-10-CM

## 2024-07-24 DIAGNOSIS — E782 Mixed hyperlipidemia: Secondary | ICD-10-CM

## 2024-07-24 DIAGNOSIS — E559 Vitamin D deficiency, unspecified: Secondary | ICD-10-CM | POA: Diagnosis not present

## 2024-07-24 DIAGNOSIS — Z6825 Body mass index (BMI) 25.0-25.9, adult: Secondary | ICD-10-CM

## 2024-07-24 DIAGNOSIS — E663 Overweight: Secondary | ICD-10-CM

## 2024-07-24 DIAGNOSIS — Z113 Encounter for screening for infections with a predominantly sexual mode of transmission: Secondary | ICD-10-CM

## 2024-07-24 DIAGNOSIS — Z79899 Other long term (current) drug therapy: Secondary | ICD-10-CM

## 2024-07-24 MED ORDER — PHENTERMINE HCL 37.5 MG PO TABS: 18.2500 mg | ORAL_TABLET | Freq: Every day | ORAL | 3 refills | Status: DC

## 2024-07-24 MED ORDER — ALBUTEROL SULFATE HFA 108 (90 BASE) MCG/ACT IN AERS: 1.0000 | INHALATION_SPRAY | Freq: Four times a day (QID) | RESPIRATORY_TRACT | 5 refills | Status: AC | PRN

## 2024-07-24 NOTE — Progress Notes (Signed)
 [facility] [address] [place], [place] [address]  Internal MEDICINE  Office Visit Note  Patient Name: Laura Morton  [id number]  [id number]  Date of Service: 07/24/2024  Chief Complaint  Patient presents with   Depression   Annual Exam    HPI Laura Morton presents for an annual well visit and physical exam.  Well-appearing 38 y.o. female with anxiety and iron  deficiency anemia.  Routine CRC screening: due in 5 years Routine mammogram: due in december DEXA scan: not due yet Pap smear: due in 2028 Labs: due for routine labs  New or worsening pain: none Other concerns:  Bump/possible aneurysm of vein in left groin, consider vascular consult.    Current Medication: Outpatient Encounter Medications as of 07/24/2024  Medication Sig   albuterol  (VENTOLIN  HFA) 108 (90 Base) MCG/ACT inhaler Inhale 1-2 puffs into the lungs every 6 (six) hours as needed for wheezing or shortness of breath.   docusate sodium  (COLACE) 100 MG capsule Take 1 capsule (100 mg total) by mouth 2 (two) times daily. To keep stools soft   Ferrous Sulfate (IRON  PO) Take 1 tablet by mouth daily.   fluconazole  (DIFLUCAN ) 150 MG tablet Take 1 tablet once monthly by mouth at onset of symptom prior to start of cycle.   Multiple Vitamin (MULTIVITAMIN) tablet Take 1 tablet by mouth daily.   phentermine  (ADIPEX-P ) 37.5 MG tablet Take 0.5 tablets (18.75 mg total) by mouth daily before breakfast.   Spacer/Aero-Holding Chambers (AEROCHAMBER PLUS) inhaler Use as instructed   terbinafine  (LAMISIL ) 250 MG tablet Take 1 tablet (250 mg total) by mouth daily.   [DISCONTINUED] albuterol  (VENTOLIN  HFA) 108 (90 Base) MCG/ACT inhaler Inhale 1-2 puffs into the lungs every 6 (six) hours as needed for wheezing or shortness of breath.   [DISCONTINUED] gabapentin  (NEURONTIN ) 300 MG capsule Take 1 capsule (300 mg total) by mouth at bedtime for 14 days. Take nightly for three days after surgery, and then as needed for pain for 14 days.    [DISCONTINUED] phentermine  (ADIPEX-P ) 37.5 MG tablet Take 0.5 tablets (18.75 mg total) by mouth daily before breakfast.   No facility-administered encounter medications on file as of 07/24/2024.    Surgical History: Past Surgical History:  Procedure Laterality Date   BREAST BIOPSY Left 09/27/2022   us  biopsy/ coil clip/ path pending   BREAST BIOPSY Right 09/27/2022   us  biopsy/ heart clip/ path pending   BREAST BIOPSY Right 09/27/2022   US  RT BREAST BX W LOC DEV 1ST LESION IMG BX SPEC US  GUIDE 09/27/2022 [facility]   BREAST BIOPSY Left 09/27/2022   US  LT BREAST BX W LOC DEV 1ST LESION IMG BX SPEC US  GUIDE 09/27/2022 [facility]   LAPAROSCOPIC TUBAL LIGATION Bilateral 11/23/2023   Procedure: LAPAROSCOPIC TUBAL LIGATION;  Surgeon: Verdon Keen, MD;  Location: [facility];  Service: Gynecology;  Laterality: Bilateral;    Medical History: Past Medical History:  Diagnosis Date   Anemia    Anxiety    Asthma    Depression    Environmental allergies    Hyperthyroidism 2010   Knee fracture, left    Vertigo    Vitamin D  deficiency     Family History: Family History  Problem Relation Age of Onset   Diabetes Mother    Lupus Mother    Hypertension Mother    Hypertension Father    Heart disease Sister    Spina bifida Sister     Social History   Socioeconomic History   Marital status: Significant Other  Spouse name: Jerona Pizza   Number of children: 0   Years of education: Not on file   Highest education level: Not on file  Occupational History   Not on file  Tobacco Use   Smoking status: Never   Smokeless tobacco: Never  Vaping Use   Vaping status: Never Used  Substance and Sexual Activity   Alcohol use: Not Currently    Comment: on occassion   Drug use: No   Sexual activity: Yes    Partners: Male  Other Topics Concern   Not on file  Social History Narrative   Not on file   Social Drivers of Health   Financial Resource Strain: Not on file  Food  Insecurity: Not on file  Transportation Needs: Not on file  Physical Activity: Not on file  Stress: Not on file  Social Connections: Not on file  Intimate Partner Violence: Not on file      Review of Systems  Constitutional:  Negative for chills, fatigue and unexpected weight change.  HENT:  Negative for congestion, rhinorrhea, sneezing and sore throat.   Eyes:  Negative for redness.  Respiratory:  Negative for cough, chest tightness and shortness of breath.   Cardiovascular:  Negative for chest pain and palpitations.  Gastrointestinal:  Negative for abdominal pain, constipation, diarrhea, nausea and vomiting.  Genitourinary:  Negative for dysuria and frequency.  Musculoskeletal:  Negative for arthralgias, back pain, joint swelling and neck pain.  Skin:  Negative for rash.  Neurological: Negative.  Negative for tremors and numbness.  Hematological:  Negative for adenopathy. Does not bruise/bleed easily.  Psychiatric/Behavioral:  Negative for behavioral problems (Depression), sleep disturbance and suicidal ideas. The patient is not nervous/anxious.     Vital Signs: BP 116/84   Pulse 95   Temp 98.3 F (36.8 C)   Resp 16   Ht 5' 2 (1.575 m)   Wt 120 lb (54.4 kg)   SpO2 97%   BMI 21.95 kg/m    Physical Exam Vitals reviewed.  Constitutional:      General: She is not in acute distress.    Appearance: Normal appearance. She is well-developed and normal weight. She is not ill-appearing or diaphoretic.  HENT:     Head: Normocephalic and atraumatic.     Right Ear: Tympanic membrane, ear canal and external ear normal.     Left Ear: Tympanic membrane, ear canal and external ear normal.     Nose: Nose normal.     Mouth/Throat:     Mouth: Mucous membranes are moist.     Pharynx: Oropharynx is clear. No oropharyngeal exudate or posterior oropharyngeal erythema.  Eyes:     General: No scleral icterus.       Right eye: No discharge.        Left eye: No discharge.      Extraocular Movements: Extraocular movements intact.     Conjunctiva/sclera: Conjunctivae normal.     Pupils: Pupils are equal, round, and reactive to light.  Neck:     Thyroid : No thyromegaly.     Vascular: No JVD.     Trachea: No tracheal deviation.  Cardiovascular:     Rate and Rhythm: Normal rate and regular rhythm.     Heart sounds: Normal heart sounds. No murmur heard.    No friction rub. No gallop.  Pulmonary:     Effort: Pulmonary effort is normal. No respiratory distress.     Breath sounds: Normal breath sounds. No stridor. No wheezing or rales.  Chest:     Chest wall: No tenderness.  Breasts:    Right: Normal.     Left: Normal.     Comments: Bilateral cyst present mobile Abdominal:     General: Bowel sounds are normal. There is no distension.     Palpations: Abdomen is soft. There is no mass.     Tenderness: There is no abdominal tenderness. There is no guarding or rebound.  Musculoskeletal:        General: No tenderness or deformity. Normal range of motion.     Cervical back: Normal range of motion and neck supple.     Right lower leg: No edema.     Left lower leg: No edema.  Lymphadenopathy:     Cervical: No cervical adenopathy.  Skin:    General: Skin is warm and dry.     Capillary Refill: Capillary refill takes less than 2 seconds.     Coloration: Skin is not pale.     Findings: No erythema or rash.         Comments: Marked area is a possible AVM or aneurysm. Asymptomatic but noticeable.   Neurological:     Mental Status: She is alert and oriented to person, place, and time.     Cranial Nerves: No cranial nerve deficit.     Motor: No abnormal muscle tone.     Coordination: Coordination normal.     Gait: Gait normal.     Deep Tendon Reflexes: Reflexes are normal and symmetric.  Psychiatric:        Mood and Affect: Mood normal.        Behavior: Behavior normal.        Thought Content: Thought content normal.        Judgment: Judgment normal.         Assessment/Plan: 1. Encounter for routine adult health examination with abnormal findings (Primary) Age-appropriate preventive screenings and vaccinations discussed, annual physical exam completed. Routine labs for health maintenance ordered, see below. PHM updated.   - albuterol  (VENTOLIN  HFA) 108 (90 Base) MCG/ACT inhaler; Inhale 1-2 puffs into the lungs every 6 (six) hours as needed for wheezing or shortness of breath.  Dispense: 8 g; Refill: 5 - CMP14+EGFR - CBC with Differential/Platelet - Lipid Profile - Vitamin D  (25 hydroxy)  2. Mixed hyperlipidemia Routine labs ordered  - CMP14+EGFR - CBC with Differential/Platelet - Lipid Profile - Vitamin D  (25 hydroxy)  3. 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 Asymptomatic varicose veins Referred to vascular surgery  - Ambulatory referral to Vascular Surgery  6. Overweight with body mass index (BMI) of 25 to 25.9 in adult Continue phentermine  as prescribed  - phentermine  (ADIPEX-P ) 37.5 MG tablet; Take 0.5 tablets (18.75 mg total) by mouth daily before breakfast.  Dispense: 15 tablet; Refill: 3  7. Screening for STDs (sexually transmitted diseases) Routine labs ordered and urine sent to lab  - STI Profile - Chlamydia/Gonococcus/Trichomonas, NAA     General Counseling: Laura Morton understanding of the findings of todays visit and agrees with plan of treatment. I have discussed any further diagnostic evaluation that may be needed or ordered today. We also reviewed her medications today. she has been encouraged to call the office with any questions or concerns that should arise related to todays visit.  Orders Placed This Encounter  Procedures   Chlamydia/Gonococcus/Trichomonas, NAA   STI  Profile   CMP14+EGFR   CBC with Differential/Platelet   Lipid Profile   Vitamin D  (25 hydroxy)    Meds ordered this encounter  Medications   phentermine  (ADIPEX-P ) 37.5 MG tablet    Sig: Take 0.5 tablets (18.75 mg total) by mouth daily before breakfast.    Dispense:  15 tablet    Refill:  3    Will bring goodrx coupon, do not run through insurance.   albuterol  (VENTOLIN  HFA) 108 (90 Base) MCG/ACT inhaler    Sig: Inhale 1-2 puffs into the lungs every 6 (six) hours as needed for wheezing or shortness of breath.    Dispense:  8 g    Refill:  5    Patient will call if she needs this filled, do not fill unless it is requested by patient.    Return in about 1 month (around 08/23/2024) for F/U, Shakeda Pearse PCP in person but may need virtual appt to discuss US  results and labs .   Total time spent:30 Minutes Time spent includes review of chart, medications, test results, and follow up plan with the patient.   [place] Controlled Substance Database was reviewed by me.  This patient was seen by Mardy Maxin, FNP-C in collaboration with Dr. Sigrid Bathe as a part of collaborative care agreement.  Dream Nodal R. Maxin, MSN, FNP-C Internal medicine

## 2024-07-27 LAB — CHLAMYDIA/GONOCOCCUS/TRICHOMONAS, NAA
Chlamydia by NAA: POSITIVE — AB
Gonococcus by NAA: NEGATIVE
Trich vag by NAA: NEGATIVE

## 2024-07-28 ENCOUNTER — Ambulatory Visit: Payer: Self-pay | Admitting: Nurse Practitioner

## 2024-07-28 ENCOUNTER — Telehealth: Payer: Self-pay

## 2024-07-28 MED ORDER — DOXYCYCLINE HYCLATE 100 MG PO TABS: 100.0000 mg | ORAL_TABLET | Freq: Two times a day (BID) | ORAL | 0 refills | Status: AC

## 2024-07-28 NOTE — Telephone Encounter (Signed)
 Sent patient a MyChart message as I couldn't leave her a voicemail.

## 2024-07-28 NOTE — Progress Notes (Signed)
 Please let the patient know that she tested positive for chlamydia and I have sent her treatment to the pharmacy -- doxycycline  100 mg twice daily for 7 days

## 2024-07-31 ENCOUNTER — Other Ambulatory Visit
Admission: RE | Admit: 2024-07-31 | Discharge: 2024-07-31 | Disposition: A | Discharge status: Home / Self Care | Attending: Nurse Practitioner | Admitting: Nurse Practitioner

## 2024-07-31 DIAGNOSIS — Z0001 Encounter for general adult medical examination with abnormal findings: Secondary | ICD-10-CM | POA: Diagnosis present

## 2024-07-31 DIAGNOSIS — E559 Vitamin D deficiency, unspecified: Secondary | ICD-10-CM | POA: Diagnosis not present

## 2024-07-31 DIAGNOSIS — Z113 Encounter for screening for infections with a predominantly sexual mode of transmission: Secondary | ICD-10-CM | POA: Diagnosis not present

## 2024-07-31 DIAGNOSIS — E782 Mixed hyperlipidemia: Secondary | ICD-10-CM | POA: Diagnosis not present

## 2024-07-31 LAB — COMPREHENSIVE METABOLIC PANEL WITH GFR
ALT: 15 U/L (ref 0–44)
AST: 18 U/L (ref 15–41)
Albumin: 3.7 g/dL (ref 3.5–5.0)
Alkaline Phosphatase: 49 U/L (ref 38–126)
Anion gap: 6 (ref 5–15)
BUN: 12 mg/dL (ref 6–20)
CO2: 26 mmol/L (ref 22–32)
Calcium: 8.6 mg/dL — ABNORMAL LOW (ref 8.9–10.3)
Chloride: 106 mmol/L (ref 98–111)
Creatinine, Ser: 0.65 mg/dL (ref 0.44–1.00)
GFR, Estimated: >60 mL/min (ref >60)
Glucose, Bld: 91 mg/dL (ref 70–99)
Potassium: 3.9 mmol/L (ref 3.5–5.1)
Sodium: 138 mmol/L (ref 135–145)
Total Bilirubin: 0.9 mg/dL (ref 0.0–1.2)
Total Protein: 6.9 g/dL (ref 6.5–8.1)

## 2024-07-31 LAB — LIPID PANEL
Cholesterol: 160 mg/dL (ref 0–200)
HDL: 84 mg/dL (ref >40)
LDL Cholesterol: 67 mg/dL (ref 0–99)
Total CHOL/HDL Ratio: 1.9 ratio
Triglycerides: 46 mg/dL (ref <150)
VLDL: 9 mg/dL (ref 0–40)

## 2024-07-31 LAB — CBC WITH DIFFERENTIAL/PLATELET
Abs Immature Granulocytes: 0.01 K/uL (ref 0.00–0.07)
Basophils Absolute: 0 K/uL (ref 0.0–0.1)
Basophils Relative: 1 %
Eosinophils Absolute: 0.1 K/uL (ref 0.0–0.5)
Eosinophils Relative: 3 %
HCT: 42.1 % (ref 36.0–46.0)
Hemoglobin: 13.4 g/dL (ref 12.0–15.0)
Immature Granulocytes: 0 %
Lymphocytes Relative: 28 %
Lymphs Abs: 1.1 K/uL (ref 0.7–4.0)
MCH: 28.2 pg (ref 26.0–34.0)
MCHC: 31.8 g/dL (ref 30.0–36.0)
MCV: 88.6 fL (ref 80.0–100.0)
Monocytes Absolute: 0.4 K/uL (ref 0.1–1.0)
Monocytes Relative: 9 %
Neutro Abs: 2.5 K/uL (ref 1.7–7.7)
Neutrophils Relative %: 59 %
Platelets: 251 K/uL (ref 150–400)
RBC: 4.75 MIL/uL (ref 3.87–5.11)
RDW: 13.8 % (ref 11.5–15.5)
WBC: 4.1 K/uL (ref 4.0–10.5)
nRBC: 0 % (ref 0.0–0.2)

## 2024-07-31 LAB — VITAMIN D 25 HYDROXY (VIT D DEFICIENCY, FRACTURES): Vit D, 25-Hydroxy: 11.61 ng/mL — ABNORMAL LOW (ref 30–100)

## 2024-08-01 LAB — MISC LABCORP TEST (SEND OUT): Labcorp test code: 144011

## 2024-08-05 ENCOUNTER — Encounter: Payer: Self-pay | Admitting: Nurse Practitioner

## 2024-08-05 ENCOUNTER — Ambulatory Visit (INDEPENDENT_AMBULATORY_CARE_PROVIDER_SITE_OTHER): Admitting: Nurse Practitioner

## 2024-08-05 VITALS — BP 126/74 | HR 94 | Temp 98.1°F | Resp 16 | Ht 62.0 in | Wt 128.4 lb

## 2024-08-05 DIAGNOSIS — I8392 Asymptomatic varicose veins of left lower extremity: Secondary | ICD-10-CM

## 2024-08-05 DIAGNOSIS — A749 Chlamydial infection, unspecified: Secondary | ICD-10-CM | POA: Diagnosis not present

## 2024-08-05 DIAGNOSIS — Z113 Encounter for screening for infections with a predominantly sexual mode of transmission: Secondary | ICD-10-CM

## 2024-08-05 DIAGNOSIS — E559 Vitamin D deficiency, unspecified: Secondary | ICD-10-CM | POA: Diagnosis not present

## 2024-08-05 MED ORDER — VITAMIN D (ERGOCALCIFEROL) 1.25 MG (50000 UNIT) PO CAPS
50000.0000 [IU] | ORAL_CAPSULE | ORAL | 1 refills | Status: AC
Start: 2024-08-05 — End: ?

## 2024-08-05 NOTE — Progress Notes (Signed)
 [facility] [address] [place], [place] [address]  Internal MEDICINE  Office Visit Note  Patient Name: Laura Morton  [id number]  [id number]  Date of Service: 08/05/2024  Chief Complaint  Patient presents with   Acute Visit    Std check     HPI Laura Morton presents for a follow-up visit for retest for chlamydia, varicose vein and labs.  Retest for STD urine Possible varicose vein in the left groin area.  Low vitamin D  at 11.61.  STI profile negative and not immune to HBV Liver function is normal   Current Medication: Outpatient Encounter Medications as of 08/05/2024  Medication Sig   Vitamin D , Ergocalciferol , (DRISDOL ) 1.25 MG (50000 UNIT) CAPS capsule Take 1 capsule (50,000 Units total) by mouth every 7 (seven) days.   albuterol  (VENTOLIN  HFA) 108 (90 Base) MCG/ACT inhaler Inhale 1-2 puffs into the lungs every 6 (six) hours as needed for wheezing or shortness of breath.   docusate sodium  (COLACE) 100 MG capsule Take 1 capsule (100 mg total) by mouth 2 (two) times daily. To keep stools soft   Ferrous Sulfate (IRON  PO) Take 1 tablet by mouth daily.   fluconazole  (DIFLUCAN ) 150 MG tablet Take 1 tablet once monthly by mouth at onset of symptom prior to start of cycle.   Multiple Vitamin (MULTIVITAMIN) tablet Take 1 tablet by mouth daily.   phentermine  (ADIPEX-P ) 37.5 MG tablet Take 0.5 tablets (18.75 mg total) by mouth daily before breakfast.   Spacer/Aero-Holding Chambers (AEROCHAMBER PLUS) inhaler Use as instructed   terbinafine  (LAMISIL ) 250 MG tablet Take 1 tablet (250 mg total) by mouth daily.   No facility-administered encounter medications on file as of 08/05/2024.    Surgical History: Past Surgical History:  Procedure Laterality Date   BREAST BIOPSY Left 09/27/2022   us  biopsy/ coil clip/ path pending   BREAST BIOPSY Right 09/27/2022   us  biopsy/ heart clip/ path pending   BREAST BIOPSY Right 09/27/2022   US  RT BREAST BX W LOC DEV 1ST LESION IMG BX SPEC US   GUIDE 09/27/2022 [facility]   BREAST BIOPSY Left 09/27/2022   US  LT BREAST BX W LOC DEV 1ST LESION IMG BX SPEC US  GUIDE 09/27/2022 [facility]   LAPAROSCOPIC TUBAL LIGATION Bilateral 11/23/2023   Procedure: LAPAROSCOPIC TUBAL LIGATION;  Surgeon: Laura Keen, MD;  Location: [facility];  Service: Gynecology;  Laterality: Bilateral;    Medical History: Past Medical History:  Diagnosis Date   Anemia    Anxiety    Asthma    Depression    Environmental allergies    Hyperthyroidism 2010   Knee fracture, left    Vertigo    Vitamin D  deficiency     Family History: Family History  Problem Relation Age of Onset   Diabetes Mother    Lupus Mother    Hypertension Mother    Hypertension Father    Heart disease Sister    Spina bifida Sister     Social History   Socioeconomic History   Marital status: Significant Other    Spouse name: Laura Morton   Number of children: 0   Years of education: Not on file   Highest education level: Not on file  Occupational History   Not on file  Tobacco Use   Smoking status: Never   Smokeless tobacco: Never  Vaping Use   Vaping status: Never Used  Substance and Sexual Activity   Alcohol use: Not Currently    Comment: on occassion   Drug use: No  Sexual activity: Yes    Partners: Male  Other Topics Concern   Not on file  Social History Narrative   Not on file   Social Drivers of Health   Financial Resource Strain: Not on file  Food Insecurity: Not on file  Transportation Needs: Not on file  Physical Activity: Not on file  Stress: Not on file  Social Connections: Not on file  Intimate Partner Violence: Not on file      Review of Systems  Constitutional:  Negative for chills, fatigue and unexpected weight change.  HENT:  Negative for congestion, postnasal drip, rhinorrhea, sneezing and sore throat.   Eyes:  Negative for redness.  Respiratory: Negative.  Negative for cough, chest tightness, shortness of breath and  wheezing.   Cardiovascular: Negative.  Negative for chest pain and palpitations.  Gastrointestinal:  Negative for abdominal pain, constipation, diarrhea, nausea and vomiting.  Genitourinary:  Negative for dysuria and frequency.  Musculoskeletal:  Negative for arthralgias, back pain, joint swelling and neck pain.  Skin:  Negative for rash.  Neurological: Negative.  Negative for tremors and numbness.  Hematological:  Negative for adenopathy. Does not bruise/bleed easily.  Psychiatric/Behavioral:  Negative for behavioral problems (Depression), sleep disturbance and suicidal ideas. The patient is not nervous/anxious.     Vital Signs: BP 126/74   Pulse 94   Temp 98.1 F (36.7 C)   Resp 16   Ht 5' 2 (1.575 m)   Wt 128 lb 6.4 oz (58.2 kg)   SpO2 95%   BMI 23.48 kg/m    Physical Exam Vitals reviewed.  Constitutional:      General: She is not in acute distress.    Appearance: Normal appearance. She is normal weight. She is not ill-appearing.  HENT:     Head: Normocephalic and atraumatic.  Eyes:     Pupils: Pupils are equal, round, and reactive to light.  Cardiovascular:     Rate and Rhythm: Normal rate and regular rhythm.  Pulmonary:     Effort: Pulmonary effort is normal. No respiratory distress.  Neurological:     Mental Status: She is alert and oriented to person, place, and time.  Psychiatric:        Mood and Affect: Mood normal.        Behavior: Behavior normal.        Assessment/Plan: 1. Asymptomatic varicose veins of left lower extremity (Primary) Vascular ultrasound ordered for varicose vein.  - US  Venous Img Lower Unilateral Left (DVT); Future  2. Chlamydia Urine sent to recheck for STDs.  - Chlamydia/Gonococcus/Trichomonas, NAA  3. Vitamin D  deficiency Restart vitamin D  weekly supplement as prescribed.  - Vitamin D , Ergocalciferol , (DRISDOL ) 1.25 MG (50000 UNIT) CAPS capsule; Take 1 capsule (50,000 Units total) by mouth every 7 (seven) days.  Dispense: 12  capsule; Refill: 1  4. Screen for sexually transmitted diseases Urine sent for STD testing  - Chlamydia/Gonococcus/Trichomonas, NAA   General Counseling: Laura Morton understanding of the findings of todays visit and agrees with plan of treatment. I have discussed any further diagnostic evaluation that may be needed or ordered today. We also reviewed her medications today. she has been encouraged to call the office with any questions or concerns that should arise related to todays visit.    Orders Placed This Encounter  Procedures   Chlamydia/Gonococcus/Trichomonas, NAA   US  Venous Img Lower Unilateral Left (DVT)    Meds ordered this encounter  Medications   Vitamin D , Ergocalciferol , (DRISDOL ) 1.25 MG (50000 UNIT)  CAPS capsule    Sig: Take 1 capsule (50,000 Units total) by mouth every 7 (seven) days.    Dispense:  12 capsule    Refill:  1    Fill new script today.    Return for previously scheduled, F/U, Takeisha Cianci PCP in october.   Total time spent:30 Minutes Time spent includes review of chart, medications, test results, and follow up plan with the patient.   [place] Controlled Substance Database was reviewed by me.  This patient was seen by Mardy Maxin, FNP-C in collaboration with Dr. Sigrid Bathe as a part of collaborative care agreement.   Ortha Metts R. Maxin, MSN, FNP-C Internal medicine

## 2024-08-10 LAB — CHLAMYDIA/GONOCOCCUS/TRICHOMONAS, NAA
Chlamydia by NAA: NEGATIVE
Gonococcus by NAA: NEGATIVE

## 2024-09-04 ENCOUNTER — Ambulatory Visit: Admitting: Nurse Practitioner

## 2024-09-05 ENCOUNTER — Encounter: Payer: Self-pay | Admitting: Nurse Practitioner

## 2024-09-11 ENCOUNTER — Ambulatory Visit (INDEPENDENT_AMBULATORY_CARE_PROVIDER_SITE_OTHER): Admitting: Vascular Surgery

## 2024-09-11 ENCOUNTER — Encounter (INDEPENDENT_AMBULATORY_CARE_PROVIDER_SITE_OTHER): Payer: Self-pay | Admitting: Vascular Surgery

## 2024-09-11 VITALS — BP 129/78 | HR 80 | Resp 18 | Wt 124.0 lb

## 2024-09-11 DIAGNOSIS — K4090 Unilateral inguinal hernia, without obstruction or gangrene, not specified as recurrent: Secondary | ICD-10-CM

## 2024-09-11 DIAGNOSIS — J4540 Moderate persistent asthma, uncomplicated: Secondary | ICD-10-CM

## 2024-09-16 NOTE — Progress Notes (Signed)
 Subjective:    Patient ID: Laura Morton, female    DOB: October 20, 1986, 38 y.o.   MRN: [id number] Chief Complaint  Patient presents with   New Patient (Initial Visit)    Ref Abernathy consult varicose veins    Laura Morton is a 38 yo female who presents to clinic today concerned about varicose veins to her left upper extremity.  Patient states this started about 3 to 6 months ago.  She states that she noticed it after she has lost some weight and had started weight lifting as her exercise program.  She denies any pain to the area of the varicose vein.  She states that it does not have any color if she is concerned about the appearance of it and concerned that there might be a clot in this area.    Review of Systems  Constitutional: Negative.   Cardiovascular:        Bulging varicose vein to left groin area.  All other systems reviewed and are negative.      Objective:   Physical Exam Vitals reviewed.  Constitutional:      Appearance: Normal appearance. She is normal weight.  HENT:     Head: Normocephalic.  Eyes:     Pupils: Pupils are equal, round, and reactive to light.  Cardiovascular:     Rate and Rhythm: Normal rate and regular rhythm.     Pulses: Normal pulses.     Heart sounds: Normal heart sounds.  Pulmonary:     Effort: Pulmonary effort is normal.     Breath sounds: Normal breath sounds.  Abdominal:     General: Abdomen is flat. Bowel sounds are normal.     Palpations: Abdomen is soft.     Hernia: A hernia is present.     Comments: Patient noted to have a left inguinal hernia on exam.  Musculoskeletal:        General: Normal range of motion.     Cervical back: Normal range of motion.  Skin:    General: Skin is warm and dry.     Capillary Refill: Capillary refill takes 2 to 3 seconds.  Neurological:     General: No focal deficit present.     Mental Status: She is alert and oriented to person, place, and time. Mental status is at baseline.  Psychiatric:         Mood and Affect: Mood normal.        Behavior: Behavior normal.        Thought Content: Thought content normal.        Judgment: Judgment normal.     BP 129/78   Pulse 80   Resp 18   Wt 124 lb (56.2 kg)   BMI 22.68 kg/m   Past Medical History:  Diagnosis Date   Anemia    Anxiety    Asthma    Depression    Environmental allergies    Hyperthyroidism 2010   Knee fracture, left    Vertigo    Vitamin D  deficiency     Social History   Socioeconomic History   Marital status: Significant Other    Spouse name: Jerona Pizza   Number of children: 0   Years of education: Not on file   Highest education level: Not on file  Occupational History   Not on file  Tobacco Use   Smoking status: Never   Smokeless tobacco: Never  Vaping Use   Vaping status: Never Used  Substance and Sexual Activity  Alcohol use: Not Currently    Comment: on occassion   Drug use: No   Sexual activity: Yes    Partners: Male  Other Topics Concern   Not on file  Social History Narrative   Not on file   Social Drivers of Health   Financial Resource Strain: Not on file  Food Insecurity: Not on file  Transportation Needs: Not on file  Physical Activity: Not on file  Stress: Not on file  Social Connections: Not on file  Intimate Partner Violence: Not on file    Past Surgical History:  Procedure Laterality Date   BREAST BIOPSY Left 09/27/2022   us  biopsy/ coil clip/ path pending   BREAST BIOPSY Right 09/27/2022   us  biopsy/ heart clip/ path pending   BREAST BIOPSY Right 09/27/2022   US  RT BREAST BX W LOC DEV 1ST LESION IMG BX SPEC US  GUIDE 09/27/2022 [facility]   BREAST BIOPSY Left 09/27/2022   US  LT BREAST BX W LOC DEV 1ST LESION IMG BX SPEC US  GUIDE 09/27/2022 [facility]   LAPAROSCOPIC TUBAL LIGATION Bilateral 11/23/2023   Procedure: LAPAROSCOPIC TUBAL LIGATION;  Surgeon: Verdon Keen, MD;  Location: [facility];  Service: Gynecology;  Laterality: Bilateral;    Family  History  Problem Relation Age of Onset   Diabetes Mother    Lupus Mother    Hypertension Mother    Hypertension Father    Heart disease Sister    Spina bifida Sister     Allergies  Allergen Reactions   Bactrim [Sulfamethoxazole-Trimethoprim]     Per patient request, never get prescribe this medication Sister got Stevens-Johnson syndrome after taking   Lamictal [Lamotrigine]     Per patient request, never get prescribe this medication Sister got Stevens-Johnson syndrome after taking       Latest Ref Rng & Units 07/31/2024   10:42 AM 11/20/2023    9:03 AM 06/28/2023    9:06 AM  CBC  WBC 4.0 - 10.5 K/uL 4.1  4.8  4.5   Hemoglobin 12.0 - 15.0 g/dL 86.5  86.2  85.8   Hematocrit 36.0 - 46.0 % 42.1  42.3  43.7   Platelets 150 - 400 K/uL 251  319  314       CMP     Component Value Date/Time   NA 138 07/31/2024 1042   NA 138 11/09/2021 1045   NA 139 08/03/2013 1239   K 3.9 07/31/2024 1042   K 4.3 08/03/2013 1239   CL 106 07/31/2024 1042   CL 105 08/03/2013 1239   CO2 26 07/31/2024 1042   CO2 24 08/03/2013 1239   GLUCOSE 91 07/31/2024 1042   GLUCOSE 93 08/03/2013 1239   BUN 12 07/31/2024 1042   BUN 7 11/09/2021 1045   BUN 13 08/03/2013 1239   CREATININE 0.65 07/31/2024 1042   CREATININE 0.56 (L) 08/03/2013 1239   CALCIUM 8.6 (L) 07/31/2024 1042   CALCIUM 8.6 08/03/2013 1239   PROT 6.9 07/31/2024 1042   PROT 6.9 11/09/2021 1045   PROT 7.5 08/03/2013 1239   ALBUMIN 3.7 07/31/2024 1042   ALBUMIN 4.3 11/09/2021 1045   ALBUMIN 3.9 08/03/2013 1239   AST 18 07/31/2024 1042   AST 15 08/03/2013 1239   ALT 15 07/31/2024 1042   ALT 18 08/03/2013 1239   ALKPHOS 49 07/31/2024 1042   ALKPHOS 61 08/03/2013 1239   BILITOT 0.9 07/31/2024 1042   BILITOT 0.2 11/09/2021 1045   BILITOT 0.3 08/03/2013 1239   EGFR 112 11/09/2021 1045  GFRNONAA >60 07/31/2024 1042   GFRNONAA >60 08/03/2013 1239     No results found.     Assessment & Plan:   1. Unilateral inguinal hernia  without obstruction or gangrene, recurrence not specified (Primary) Patient presented to clinic today with left upper thigh protrusion.  She thought this was a varicose vein without any type of discoloration.  Upon exam this bulge to her left upper thigh and groin area was soft and was easily reduced upon palpation.  This area was nonpainful and nonpulsatile.  Patient states that she has lost 40+ pounds over the last couple of months and she started a weightlifting program.  I believe what she has left inguinal hernia that has not incarcerated her bowel.  It is not pulsatile and the hernia will reduce on its own when she is laying flat and it appears when she sits straight up.  Therefore I do not believe she has a vascular issue or varicose veins at this time.  I recommend that she follow-up with her PCP who can direct her into a general surgeon for possible left inguinal hernia repair.  No reason to follow-up with vein and vascular services at this time.  She can follow-up with us  as needed for any other vascular issues she may have.  2. Moderate persistent asthma without complication Continue pulmonary medications and aerosols as already ordered, these medications have been reviewed and there are no changes at this time.    Current Outpatient Medications on File Prior to Visit  Medication Sig Dispense Refill   albuterol  (VENTOLIN  HFA) 108 (90 Base) MCG/ACT inhaler Inhale 1-2 puffs into the lungs every 6 (six) hours as needed for wheezing or shortness of breath. 8 g 5   docusate sodium  (COLACE) 100 MG capsule Take 1 capsule (100 mg total) by mouth 2 (two) times daily. To keep stools soft 30 capsule 0   Ferrous Sulfate (IRON  PO) Take 1 tablet by mouth daily.     fluconazole  (DIFLUCAN ) 150 MG tablet Take 1 tablet once monthly by mouth at onset of symptom prior to start of cycle. 12 tablet 0   Multiple Vitamin (MULTIVITAMIN) tablet Take 1 tablet by mouth daily.     phentermine  (ADIPEX-P ) 37.5 MG  tablet Take 0.5 tablets (18.75 mg total) by mouth daily before breakfast. 15 tablet 3   Spacer/Aero-Holding Chambers (AEROCHAMBER PLUS) inhaler Use as instructed 1 each 2   Vitamin D , Ergocalciferol , (DRISDOL ) 1.25 MG (50000 UNIT) CAPS capsule Take 1 capsule (50,000 Units total) by mouth every 7 (seven) days. 12 capsule 1   No current facility-administered medications on file prior to visit.    There are no Patient Instructions on file for this visit. No follow-ups on file.   Gwendlyn JONELLE Shank, NP

## 2024-10-28 ENCOUNTER — Telehealth: Payer: Self-pay | Admitting: Nurse Practitioner

## 2024-10-29 NOTE — Telephone Encounter (Signed)
 Pt advised as per alyssa she had more refill left please call phar

## 2024-10-30 ENCOUNTER — Ambulatory Visit: Admitting: Nurse Practitioner

## 2024-11-20 ENCOUNTER — Encounter: Payer: Self-pay | Admitting: Nurse Practitioner

## 2024-11-20 ENCOUNTER — Ambulatory Visit: Payer: Self-pay | Admitting: Nurse Practitioner

## 2024-11-20 VITALS — BP 130/86 | HR 87 | Temp 98.1°F | Resp 16 | Ht 62.0 in | Wt 119.0 lb

## 2024-11-20 DIAGNOSIS — Z6825 Body mass index (BMI) 25.0-25.9, adult: Secondary | ICD-10-CM

## 2024-11-20 DIAGNOSIS — E559 Vitamin D deficiency, unspecified: Secondary | ICD-10-CM

## 2024-11-20 DIAGNOSIS — E663 Overweight: Secondary | ICD-10-CM | POA: Diagnosis not present

## 2024-11-20 DIAGNOSIS — Z1231 Encounter for screening mammogram for malignant neoplasm of breast: Secondary | ICD-10-CM | POA: Diagnosis not present

## 2024-11-20 MED ORDER — PHENTERMINE HCL 37.5 MG PO TABS: 18.2500 mg | ORAL_TABLET | Freq: Every day | ORAL | 2 refills | Status: AC

## 2024-11-20 NOTE — Progress Notes (Signed)
 [facility] [address] [place], [place] [address]  Internal MEDICINE  Office Visit Note  Patient Name: Laura Morton  [id number]  [id number]  Date of Service: 11/20/2024  Chief Complaint  Patient presents with   Depression   Follow-up    HPI Laura Morton presents for a follow-up visit for new partner, weight loss, low vitamin D  and labs.  New partner who has HSV -- asking about information regarding risk and transmission.  Starting therapy on 21st.  Still taking 1/2 tab of phentermine , she is 4 lbs from her goal.  Low calcium on her last 2 labs Low vitamin D  -- has been taking weekly supplement. Due for routine mammogram.    Current Medication: Outpatient Encounter Medications as of 11/20/2024  Medication Sig   albuterol  (VENTOLIN  HFA) 108 (90 Base) MCG/ACT inhaler Inhale 1-2 puffs into the lungs every 6 (six) hours as needed for wheezing or shortness of breath.   Ferrous Sulfate (IRON  PO) Take 1 tablet by mouth daily.   Multiple Vitamin (MULTIVITAMIN) tablet Take 1 tablet by mouth daily.   phentermine  (ADIPEX-P ) 37.5 MG tablet Take 0.5 tablets (18.75 mg total) by mouth daily before breakfast.   Spacer/Aero-Holding Chambers (AEROCHAMBER PLUS) inhaler Use as instructed (Patient not taking: Reported on 11/20/2024)   Vitamin D , Ergocalciferol , (DRISDOL ) 1.25 MG (50000 UNIT) CAPS capsule Take 1 capsule (50,000 Units total) by mouth every 7 (seven) days.   [DISCONTINUED] docusate sodium  (COLACE) 100 MG capsule Take 1 capsule (100 mg total) by mouth 2 (two) times daily. To keep stools soft (Patient not taking: Reported on 11/20/2024)   [DISCONTINUED] fluconazole  (DIFLUCAN ) 150 MG tablet Take 1 tablet once monthly by mouth at onset of symptom prior to start of cycle. (Patient not taking: Reported on 11/20/2024)   [DISCONTINUED] phentermine  (ADIPEX-P ) 37.5 MG tablet Take 0.5 tablets (18.75 mg total) by mouth daily before breakfast.   No facility-administered encounter medications on file  as of 11/20/2024.    Surgical History: Past Surgical History:  Procedure Laterality Date   BREAST BIOPSY Left 09/27/2022   us  biopsy/ coil clip/ path pending   BREAST BIOPSY Right 09/27/2022   us  biopsy/ heart clip/ path pending   BREAST BIOPSY Right 09/27/2022   US  RT BREAST BX W LOC DEV 1ST LESION IMG BX SPEC US  GUIDE 09/27/2022 [facility]   BREAST BIOPSY Left 09/27/2022   US  LT BREAST BX W LOC DEV 1ST LESION IMG BX SPEC US  GUIDE 09/27/2022 [facility]   LAPAROSCOPIC TUBAL LIGATION Bilateral 11/23/2023   Procedure: LAPAROSCOPIC TUBAL LIGATION;  Surgeon: Verdon Keen, MD;  Location: [facility];  Service: Gynecology;  Laterality: Bilateral;    Medical History: Past Medical History:  Diagnosis Date   Anemia    Anxiety    Asthma    Depression    Environmental allergies    Hyperthyroidism 2010   Knee fracture, left    Vertigo    Vitamin D  deficiency     Family History: Family History  Problem Relation Age of Onset   Diabetes Mother    Lupus Mother    Hypertension Mother    Hypertension Father    Heart disease Sister    Spina bifida Sister     Social History   Socioeconomic History   Marital status: Significant Other    Spouse name: Jerona Pizza   Number of children: 0   Years of education: Not on file   Highest education level: Not on file  Occupational History   Not on file  Tobacco Use   Smoking status: Never   Smokeless tobacco: Never  Vaping Use   Vaping status: Never Used  Substance and Sexual Activity   Alcohol use: Yes    Comment: on occassion   Drug use: No   Sexual activity: Yes    Partners: Male  Other Topics Concern   Not on file  Social History Narrative   Not on file   Social Drivers of Health   Tobacco Use: Low Risk (11/20/2024)   Patient History    Smoking Tobacco Use: Never    Smokeless Tobacco Use: Never    Passive Exposure: Not on file  Financial Resource Strain: Not on file  Food Insecurity: Not on file   Transportation Needs: Not on file  Physical Activity: Not on file  Stress: Not on file  Social Connections: Not on file  Intimate Partner Violence: Not on file  Depression (PHQ2-9): Low Risk (07/24/2024)   Depression (PHQ2-9)    PHQ-2 Score: 0  Alcohol Screen: Low Risk (08/22/2022)   Alcohol Screen    Last Alcohol Screening Score (AUDIT): 1  Housing: Unknown (12/06/2023)   Received from [facility]   Epic    Unable to Pay for Housing in the Last Year: Not on file    Number of Times Moved in the Last Year: Not on file    At any time in the past 12 months, were you homeless or living in a shelter (including now)?: No  Utilities: Not on file  Health Literacy: Not on file      Review of Systems  Constitutional:  Positive for appetite change, fatigue and unexpected weight change. Negative for chills.  HENT:  Negative for congestion, postnasal drip, rhinorrhea, sneezing and sore throat.   Eyes:  Negative for redness.  Respiratory: Negative.  Negative for cough, chest tightness, shortness of breath and wheezing.   Cardiovascular: Negative.  Negative for chest pain and palpitations.  Gastrointestinal: Negative.  Negative for abdominal pain, constipation, diarrhea, nausea and vomiting.  Endocrine: Positive for cold intolerance.  Genitourinary:  Negative for dysuria and frequency.  Musculoskeletal: Negative.  Negative for arthralgias, back pain, joint swelling and neck pain.  Skin:  Negative for rash.  Neurological: Negative.  Negative for tremors and numbness.  Hematological:  Negative for adenopathy. Bruises/bleeds easily.  Psychiatric/Behavioral:  Negative for behavioral problems (Depression), sleep disturbance and suicidal ideas. The patient is not nervous/anxious.     Vital Signs: BP 130/86   Pulse 87   Temp 98.1 F (36.7 C)   Resp 16   Ht 5' 2 (1.575 m)   Wt 119 lb (54 kg)   SpO2 99%   BMI 21.77 kg/m    Physical Exam Vitals reviewed.   Constitutional:      General: She is not in acute distress.    Appearance: Normal appearance. She is normal weight. She is not ill-appearing.  HENT:     Head: Normocephalic and atraumatic.  Eyes:     Pupils: Pupils are equal, round, and reactive to light.  Cardiovascular:     Rate and Rhythm: Normal rate and regular rhythm.  Pulmonary:     Effort: Pulmonary effort is normal. No respiratory distress.  Neurological:     Mental Status: She is alert and oriented to person, place, and time.  Psychiatric:        Mood and Affect: Mood normal.        Behavior: Behavior normal.        Assessment/Plan:  1. Vitamin D  deficiency (Primary) Routine lab ordered  - Vitamin D  (25 hydroxy)  2. Hypocalcemia Repeat lab ordered  - Calcium  3. Overweight with body mass index (BMI) of 25 to 25.9 in adult Continue phentermine  for now, follow up in 3 months.  - phentermine  (ADIPEX-P ) 37.5 MG tablet; Take 0.5 tablets (18.75 mg total) by mouth daily before breakfast.  Dispense: 15 tablet; Refill: 2  4. Encounter for screening mammogram for malignant neoplasm of breast Routine mammogram ordered  - MM 3D SCREENING MAMMOGRAM BILATERAL BREAST; Future   General Counseling: Nataki verbalizes understanding of the findings of todays visit and agrees with plan of treatment. I have discussed any further diagnostic evaluation that may be needed or ordered today. We also reviewed her medications today. she has been encouraged to call the office with any questions or concerns that should arise related to todays visit.    Orders Placed This Encounter  Procedures   MM 3D SCREENING MAMMOGRAM BILATERAL BREAST   Calcium   Vitamin D  (25 hydroxy)    Meds ordered this encounter  Medications   phentermine  (ADIPEX-P ) 37.5 MG tablet    Sig: Take 0.5 tablets (18.75 mg total) by mouth daily before breakfast.    Dispense:  15 tablet    Refill:  2    Will bring goodrx coupon, do not run through insurance.     Return in about 3 months (around 02/18/2025) for F/U, Raijon Lindfors PCP, Labs.   Total time spent:30 Minutes Time spent includes review of chart, medications, test results, and follow up plan with the patient.   [place] Controlled Substance Database was reviewed by me.  This patient was seen by Mardy Maxin, FNP-C in collaboration with Dr. Sigrid Bathe as a part of collaborative care agreement.   Kashonda Sarkisyan R. Maxin, MSN, FNP-C Internal medicine

## 2024-11-21 ENCOUNTER — Encounter: Payer: Self-pay | Admitting: Nurse Practitioner

## 2025-01-01 ENCOUNTER — Ambulatory Visit

## 2025-01-15 ENCOUNTER — Ambulatory Visit

## 2025-02-26 ENCOUNTER — Ambulatory Visit: Admitting: Nurse Practitioner

## 2025-07-27 ENCOUNTER — Encounter: Payer: Self-pay | Admitting: Nurse Practitioner
